# Patient Record
Sex: Male | Born: 1965 | Race: White | Hispanic: No | Marital: Married | Smoking: Never smoker
Health system: Southern US, Community
[De-identification: ages and names within clinical notes are randomized; demographics above are authoritative.]

## PROBLEM LIST (undated history)

## (undated) DIAGNOSIS — J309 Allergic rhinitis, unspecified: Secondary | ICD-10-CM

## (undated) DIAGNOSIS — I82409 Acute embolism and thrombosis of unspecified deep veins of unspecified lower extremity: Secondary | ICD-10-CM

## (undated) DIAGNOSIS — I10 Essential (primary) hypertension: Secondary | ICD-10-CM

## (undated) DIAGNOSIS — E7800 Pure hypercholesterolemia, unspecified: Secondary | ICD-10-CM

## (undated) DIAGNOSIS — M109 Gout, unspecified: Secondary | ICD-10-CM

## (undated) DIAGNOSIS — G629 Polyneuropathy, unspecified: Secondary | ICD-10-CM

## (undated) DIAGNOSIS — M797 Fibromyalgia: Secondary | ICD-10-CM

## (undated) DIAGNOSIS — G43909 Migraine, unspecified, not intractable, without status migrainosus: Secondary | ICD-10-CM

## (undated) HISTORY — DX: Essential (primary) hypertension: I10

## (undated) HISTORY — DX: Fibromyalgia: M79.7

## (undated) HISTORY — DX: Polyneuropathy, unspecified: G62.9

## (undated) HISTORY — DX: Gout, unspecified: M10.9

## (undated) HISTORY — DX: Migraine, unspecified, not intractable, without status migrainosus: G43.909

## (undated) HISTORY — PX: ABDOMINAL EXPLORATION SURGERY: SHX538

## (undated) HISTORY — DX: Allergic rhinitis, unspecified: J30.9

## (undated) HISTORY — DX: Pure hypercholesterolemia, unspecified: E78.00

## (undated) HISTORY — DX: Acute embolism and thrombosis of unspecified deep veins of unspecified lower extremity: I82.409

## (undated) HISTORY — PX: BACK SURGERY: SHX140

---

## 1999-01-08 ENCOUNTER — Ambulatory Visit (HOSPITAL_COMMUNITY): Admission: RE | Admit: 1999-01-08 | Discharge: 1999-01-08 | Payer: Self-pay | Admitting: Neurosurgery

## 1999-01-08 ENCOUNTER — Encounter: Payer: Self-pay | Admitting: Neurosurgery

## 2000-02-28 ENCOUNTER — Encounter: Payer: Self-pay | Admitting: Neurosurgery

## 2000-02-28 ENCOUNTER — Ambulatory Visit (HOSPITAL_COMMUNITY): Admission: RE | Admit: 2000-02-28 | Discharge: 2000-02-28 | Payer: Self-pay | Admitting: Neurosurgery

## 2001-09-26 ENCOUNTER — Encounter: Payer: Self-pay | Admitting: Neurosurgery

## 2001-09-26 ENCOUNTER — Encounter: Admission: RE | Admit: 2001-09-26 | Discharge: 2001-09-26 | Payer: Self-pay | Admitting: Neurosurgery

## 2003-05-24 ENCOUNTER — Encounter: Payer: Self-pay | Admitting: Neurosurgery

## 2003-05-24 ENCOUNTER — Ambulatory Visit (HOSPITAL_COMMUNITY): Admission: RE | Admit: 2003-05-24 | Discharge: 2003-05-24 | Payer: Self-pay | Admitting: Neurosurgery

## 2004-05-24 ENCOUNTER — Ambulatory Visit (HOSPITAL_COMMUNITY): Admission: RE | Admit: 2004-05-24 | Discharge: 2004-05-24 | Payer: Self-pay | Admitting: Neurosurgery

## 2006-03-19 ENCOUNTER — Ambulatory Visit (HOSPITAL_COMMUNITY): Admission: RE | Admit: 2006-03-19 | Discharge: 2006-03-19 | Payer: Self-pay | Admitting: Neurosurgery

## 2006-08-20 ENCOUNTER — Inpatient Hospital Stay (HOSPITAL_COMMUNITY): Admission: RE | Admit: 2006-08-20 | Discharge: 2006-08-23 | Payer: Self-pay | Admitting: Neurosurgery

## 2008-02-14 ENCOUNTER — Encounter: Admission: RE | Admit: 2008-02-14 | Discharge: 2008-02-14 | Payer: Self-pay | Admitting: Neurosurgery

## 2008-10-01 ENCOUNTER — Encounter (INDEPENDENT_AMBULATORY_CARE_PROVIDER_SITE_OTHER): Payer: Self-pay | Admitting: Surgery

## 2008-10-01 ENCOUNTER — Ambulatory Visit (HOSPITAL_BASED_OUTPATIENT_CLINIC_OR_DEPARTMENT_OTHER): Admission: RE | Admit: 2008-10-01 | Discharge: 2008-10-01 | Payer: Self-pay | Admitting: Surgery

## 2008-10-09 ENCOUNTER — Emergency Department (HOSPITAL_COMMUNITY): Admission: EM | Admit: 2008-10-09 | Discharge: 2008-10-09 | Payer: Self-pay | Admitting: Emergency Medicine

## 2009-08-17 HISTORY — PX: CORONARY ANGIOPLASTY WITH STENT PLACEMENT: SHX49

## 2010-01-17 IMAGING — CT CT PELVIS W/O CM
2 of 4 series · 14 of 32 positions shown, 19 images · IV contrast (READICAT)
Comparison: Plain films same day

CT ABDOMEN

CLINICAL DATA: Pain and surgical site left side of abdomen

CT ABDOMEN AND PELVIS WITHOUT CONTRAST
TECHNIQUE: Multidetector CT imaging of the abdomen and pelvis was
performed following the standard protocol without intravenous
contrast.

[Series 2: routine abdomen · axial · 0.90mm/px · z∈[-496,-72]mm · 8 of 111 slices shown, 13 images]
[im 13/111  soft-tissue]
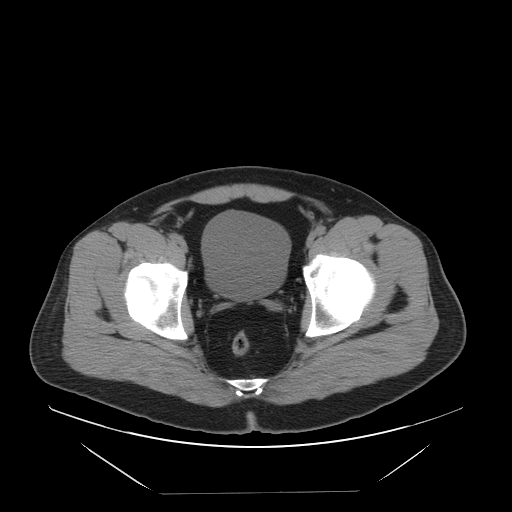
[im 13/111  bone]
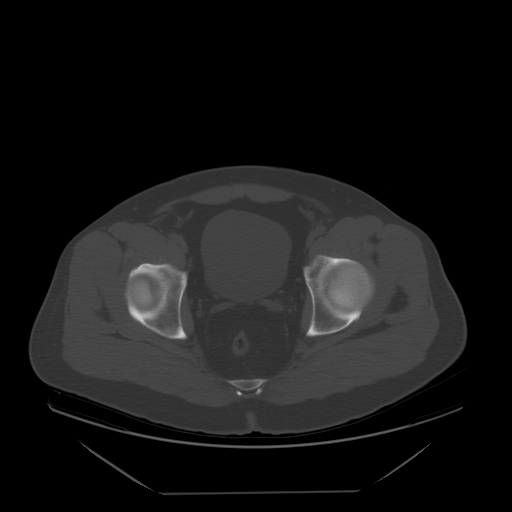
[im 25/111  soft-tissue]
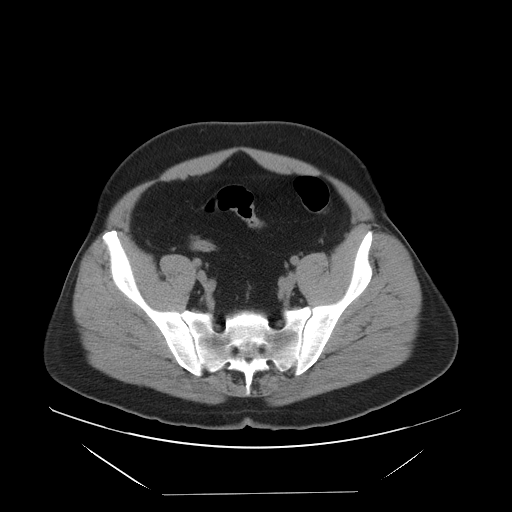
[im 37/111  soft-tissue]
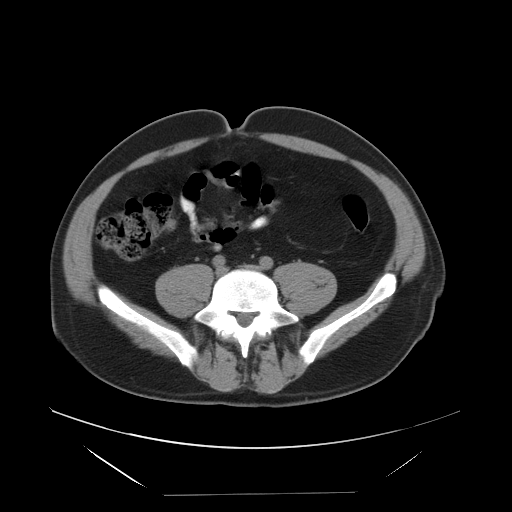
[im 49/111  soft-tissue]
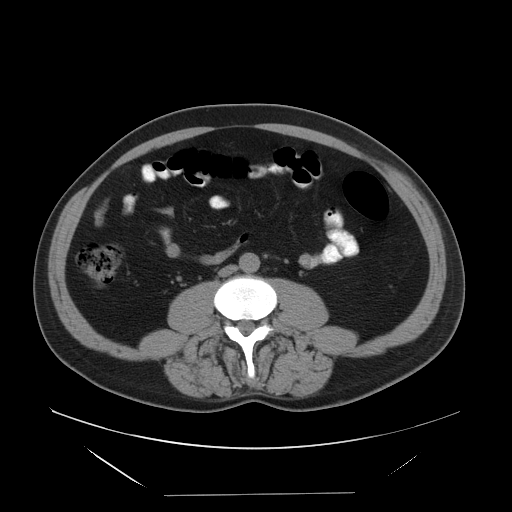
[im 62/111  soft-tissue]
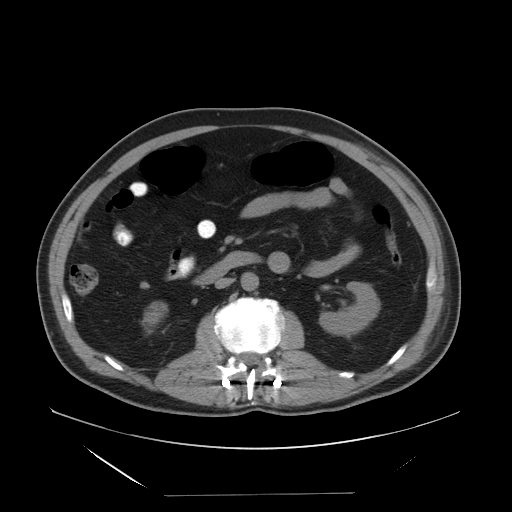
[im 62/111  lung]
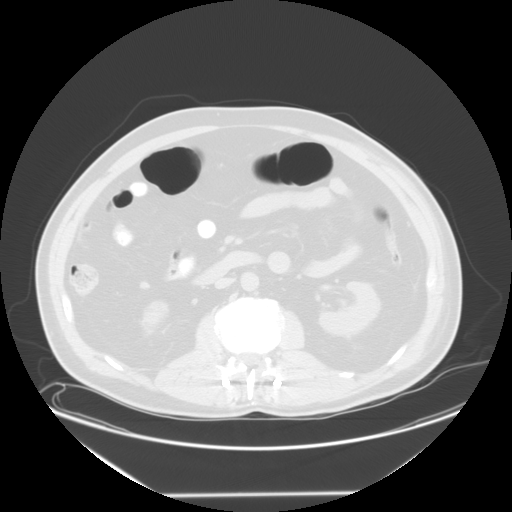
[im 74/111  soft-tissue]
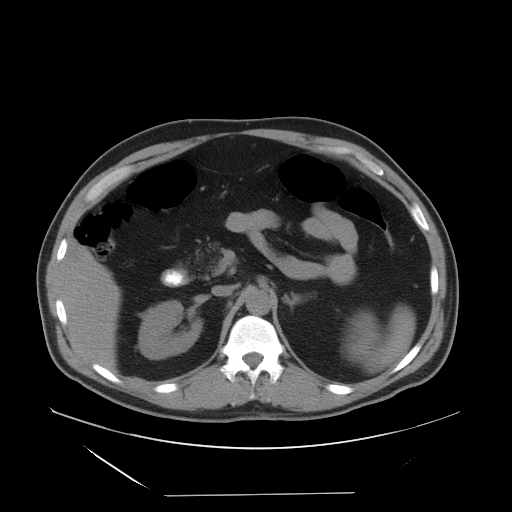
[im 74/111  lung]
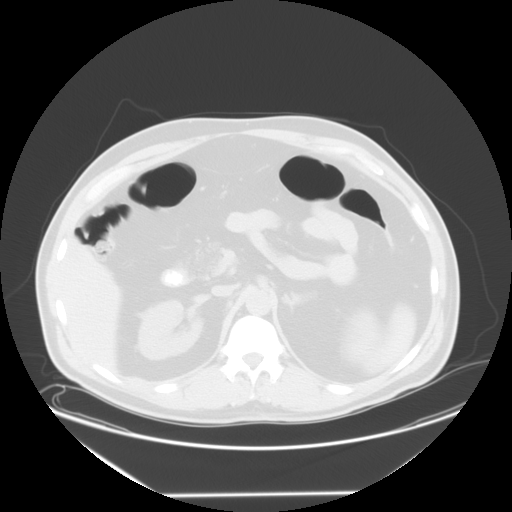
[im 86/111  soft-tissue]
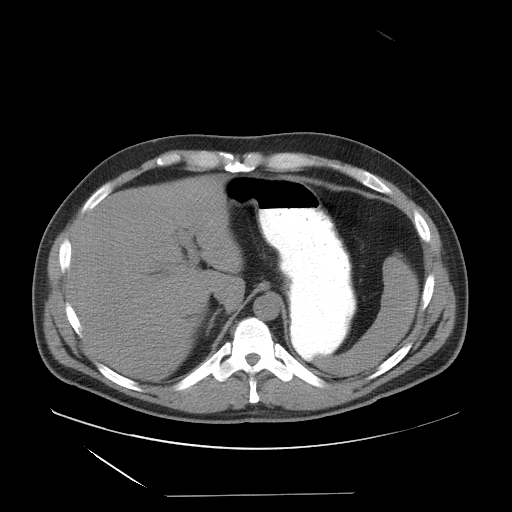
[im 86/111  lung]
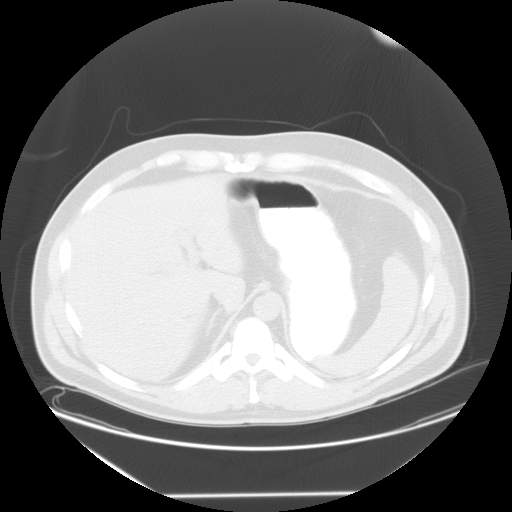
[im 98/111  soft-tissue]
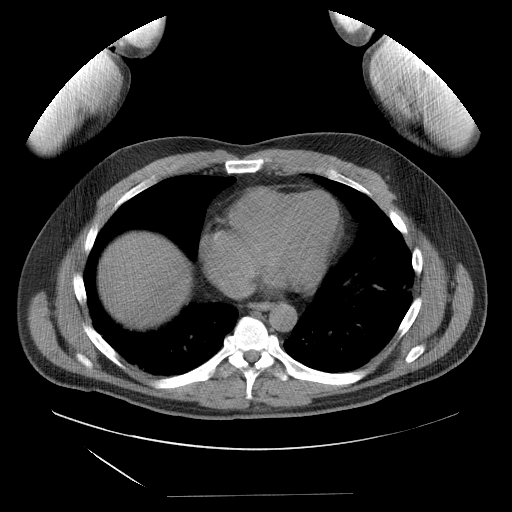
[im 98/111  lung]
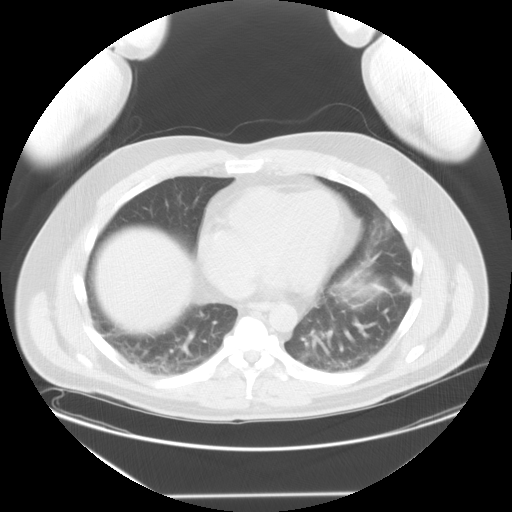

[Series 400: reformatted · sagittal · 1.09mm/px · 6 of 137 slices shown]
[im 14/137  soft-tissue]
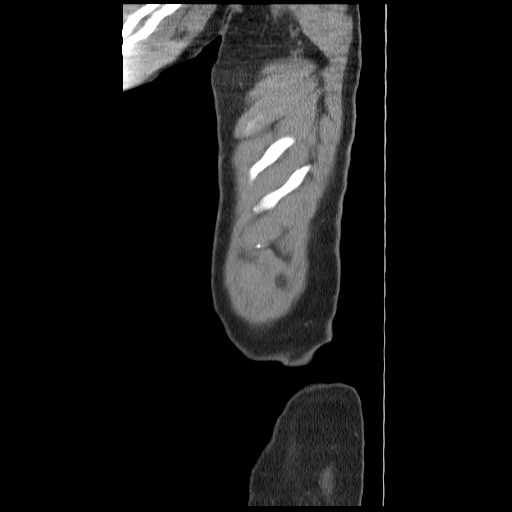
[im 28/137  soft-tissue]
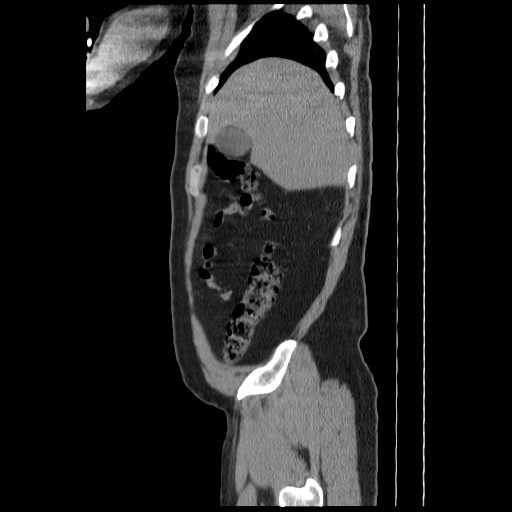
[im 41/137  soft-tissue]
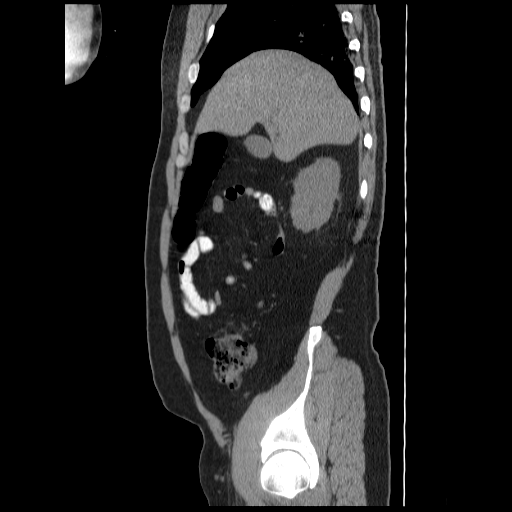
[im 55/137  soft-tissue]
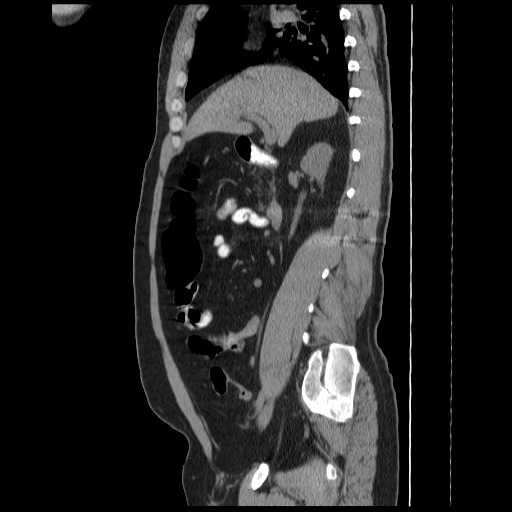
[im 82/137  soft-tissue]
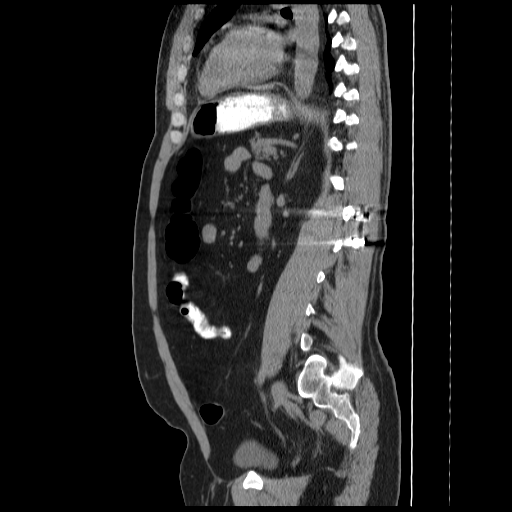
[im 96/137  soft-tissue]
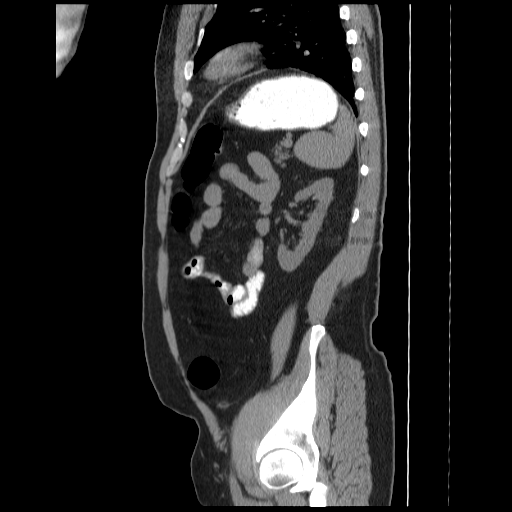

[14 of 32 positions shown; findings below may reference images not displayed]

FINDINGS: There is mild bibasilar atelectasis.  Heart appears
normal without evidence effusion.

Non-IV contrast images demonstrate no focal hepatic lesion.
Gallbladder, pancreas, spleen, adrenal glands, and kidneys appear
normal.  There are vascular calcifications in the renal hilum
versus nonobstructing stones.

The stomach, small bowel, appendix, and cecum appear normal.  Colon
appears normal.

Abdominal aorta is normal caliber.  No evidence retroperitoneal
lymphadenopathy.

Along the anterior left abdominal wall there is a very small
subcutaneous fluid collection measuring approximately 3 cm x
cm.  No evidence of gas within the collection.
IMPRESSION: Small superficial fluid collection at surgical site in the left
anterior abdominal wall.  No evidence of complication.

CT PELVIS
FINDINGS: No free fluid the pelvis.  The bladder and prostate
appear normal.  The rectum and sigmoid colon appear normal.

No evidence of pelvic lymphadenopathy. Review of  bone windows
demonstrates no aggressive osseous lesions. There is posterior
lumbar fusion at L1-L2 and interbody fusion at L5-S1.
IMPRESSION: No acute pelvic process.

## 2010-12-02 LAB — BASIC METABOLIC PANEL
BUN: 15 mg/dL (ref 6–23)
CO2: 29 mEq/L (ref 19–32)
Calcium: 9.8 mg/dL (ref 8.4–10.5)
Chloride: 104 mEq/L (ref 96–112)
Creatinine, Ser: 1.04 mg/dL (ref 0.4–1.5)
GFR calc Af Amer: >60 mL/min (ref >60)
GFR calc non Af Amer: >60 mL/min (ref >60)
Glucose, Bld: 101 mg/dL — ABNORMAL HIGH (ref 70–99)
Potassium: 4.6 mEq/L (ref 3.5–5.1)
Sodium: 141 mEq/L (ref 135–145)

## 2010-12-02 LAB — CBC
HCT: 44 % (ref 39.0–52.0)
HCT: 45.4 % (ref 39.0–52.0)
Hemoglobin: 15.3 g/dL (ref 13.0–17.0)
Hemoglobin: 15.8 g/dL (ref 13.0–17.0)
MCHC: 34.8 g/dL (ref 30.0–36.0)
MCHC: 34.9 g/dL (ref 30.0–36.0)
MCV: 83 fL (ref 78.0–100.0)
MCV: 82.9 fL (ref 78.0–100.0)
Platelets: 188 10*3/uL (ref 150–400)
Platelets: 197 10*3/uL (ref 150–400)
RBC: 5.3 MIL/uL (ref 4.22–5.81)
RBC: 5.48 MIL/uL (ref 4.22–5.81)
RDW: 13.5 % (ref 11.5–15.5)
RDW: 13.6 % (ref 11.5–15.5)
WBC: 4.1 10*3/uL (ref 4.0–10.5)
WBC: 5.2 10*3/uL (ref 4.0–10.5)

## 2010-12-02 LAB — DIFFERENTIAL
Basophils Absolute: 0 10*3/uL (ref 0.0–0.1)
Basophils Absolute: 0 10*3/uL (ref 0.0–0.1)
Basophils Relative: 1 % (ref 0–1)
Basophils Relative: 1 % (ref 0–1)
Eosinophils Absolute: 0.1 10*3/uL (ref 0.0–0.7)
Eosinophils Absolute: 0.3 10*3/uL (ref 0.0–0.7)
Eosinophils Relative: 2 % (ref 0–5)
Eosinophils Relative: 5 % (ref 0–5)
Lymphocytes Relative: 34 % (ref 12–46)
Lymphocytes Relative: 31 % (ref 12–46)
Lymphs Abs: 1.4 10*3/uL (ref 0.7–4.0)
Lymphs Abs: 1.6 10*3/uL (ref 0.7–4.0)
Monocytes Absolute: 0.3 10*3/uL (ref 0.1–1.0)
Monocytes Absolute: 0.5 10*3/uL (ref 0.1–1.0)
Monocytes Relative: 8 % (ref 3–12)
Monocytes Relative: 9 % (ref 3–12)
Neutro Abs: 2.3 10*3/uL (ref 1.7–7.7)
Neutro Abs: 2.8 10*3/uL (ref 1.7–7.7)
Neutrophils Relative %: 56 % (ref 43–77)
Neutrophils Relative %: 55 % (ref 43–77)

## 2010-12-02 LAB — URINALYSIS, ROUTINE W REFLEX MICROSCOPIC
Bilirubin Urine: NEGATIVE
Glucose, UA: NEGATIVE mg/dL
Hgb urine dipstick: NEGATIVE
Ketones, ur: NEGATIVE mg/dL
Nitrite: NEGATIVE
Protein, ur: NEGATIVE mg/dL
Specific Gravity, Urine: 1.024 (ref 1.005–1.030)
Urobilinogen, UA: 0.2 mg/dL (ref 0.0–1.0)
pH: 5.5 (ref 5.0–8.0)

## 2010-12-02 LAB — COMPREHENSIVE METABOLIC PANEL
ALT: 27 U/L (ref 0–53)
AST: 25 U/L (ref 0–37)
Albumin: 4 g/dL (ref 3.5–5.2)
Alkaline Phosphatase: 60 U/L (ref 39–117)
BUN: 21 mg/dL (ref 6–23)
CO2: 27 mEq/L (ref 19–32)
Calcium: 9.4 mg/dL (ref 8.4–10.5)
Chloride: 101 mEq/L (ref 96–112)
Creatinine, Ser: 1.34 mg/dL (ref 0.4–1.5)
GFR calc Af Amer: >60 mL/min (ref >60)
GFR calc non Af Amer: 58 mL/min — ABNORMAL LOW (ref >60)
Glucose, Bld: 112 mg/dL — ABNORMAL HIGH (ref 70–99)
Potassium: 4.1 mEq/L (ref 3.5–5.1)
Sodium: 135 mEq/L (ref 135–145)
Total Bilirubin: 1.4 mg/dL — ABNORMAL HIGH (ref 0.3–1.2)
Total Protein: 6.7 g/dL (ref 6.0–8.3)

## 2010-12-02 LAB — PROTIME-INR
INR: 1 (ref 0.00–1.49)
Prothrombin Time: 13.6 seconds (ref 11.6–15.2)

## 2010-12-30 NOTE — Op Note (Signed)
NAMEDECLAN, Jose [name] NO.:  [id number]   MEDICAL RECORD NO.:  [id number]          PATIENT TYPE:  AMB   LOCATION:  DSC                          FACILITY:  [facility]   PHYSICIAN:  Velora Heckler, MD      DATE OF BIRTH:  22-May-1966   DATE OF PROCEDURE:  10/01/2008  DATE OF DISCHARGE:                               OPERATIVE REPORT   PREOPERATIVE DIAGNOSIS:  Soft tissue mass, left upper quadrant abdominal  wall, rule out hernia.   POSTOPERATIVE DIAGNOSIS:  Soft tissue mass, left upper quadrant  abdominal wall, rule out hernia.   PROCEDURES:  1. Explore left upper quadrant abdominal wall, rule out hernia.  2. Excision soft tissue mass, left upper quadrant abdominal wall (3 x      3 x 2 cm).   SURGEON:  Velora Heckler, MD, FACS   ANESTHESIA:  General.   ESTIMATED BLOOD LOSS:  Minimal.   PREPARATION:  Chlorhexidine   COMPLICATIONS:  None.   INDICATIONS:  The patient is a 45 year old white male from [place],  [place].  The patient has had a 71-month history of pain in the  left upper quadrant of the abdominal wall.  He has had thorough  evaluation including upper endoscopy, colonoscopy, small bowel follow  through series, ultrasound, CT scan of the abdomen, and MRI scan.  He  requires narcotic analgesics and Lyrica for pain control.  The patient  is now referred for consideration of excision of soft tissue mass and  rule out hernia, left upper quadrant abdominal wall.   BODY OF REPORT:  Procedure was done in OR #3 at the [facility].  The patient was brought to the operating room, placed in supine  position on the operating room table.  Prior to anesthesia, the patient  had been placed in a standing and a recumbent position and he and I had  agreed on the site of pain and the palpable mass in the left upper  quadrant.  This had been marked with a marking pen.   The patient was placed in supine position on the operating room table.  Following administration of general anesthesia, the patient was prepped  and draped in usual strict aseptic fashion.  After ascertaining that an  adequate level of anesthesia had been achieved, a 5-cm incision was made  in the left upper quadrant of the abdominal wall at the site previously  determined.  Dissection was carried into the subcutaneous tissues.  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  It is  quite firm.  It measures 3 x 3 x 2 cm in toto.  It was excised in its  entirety with the electrocautery.  No obvious cutaneous nerves appeared  to be involved.  No obvious fascial defects were identified.   External oblique fascia was incised in line of its fibers and extended  into the lateral edge of the rectus sheath.  Plane beneath the external  oblique fascia was then developed and inspected.  No evidence of  abdominal wall hernia was identified.  Good hemostasis was noted with  the electrocautery.  Fascia was closed with interrupted 0 Ethibond  sutures.  Fascial plane was again developed circumferentially beneath  the wound and good hemostasis was noted.  No cutaneous nerves were  noted.  No further palpable masses were noted.  No significant  abnormalities were identified.  Subcutaneous tissues were closed with a  running 2-0 Vicryl suture.  Skin was anesthetized with local Marcaine  anesthetic.  Skin was closed with running 4-0 Monocryl subcuticular  suture.  Wound was washed and dried and Benzoin and Steri-Strips were  applied.  Sterile dressings were applied.  The patient was awakened from  anesthesia and brought to the recovery room in stable condition.  The  patient tolerated the procedure well.      Velora Heckler, MD  Electronically Signed     TMG/MEDQ  D:  10/01/2008  T:  10/02/2008  Job:  [id number]   cc:   Payton Doughty, M.D.  Lucila Maine

## 2011-01-02 NOTE — Discharge Summary (Signed)
[name], [name] NO.:  [id number]   MEDICAL RECORD NO.:  [id number]          PATIENT TYPE:  INP   LOCATION:  3013                         FACILITY:  [facility]   PHYSICIAN:  Payton Doughty, M.D.      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   PROCEDURES:  L1-2 laminectomy, fasciectomy, pedicle screws and  posterolateral arthrodesis.   SURGEON:  Dr. Channing Mutters   COMPLICATIONS:  None.   DISCHARGE STATUS:  Alive and well.   A 45 year old right-handed white gentleman whose history and physical  has been dictated, it has not yet reached the chart.  He has had lumbar  spondylosis.  He had a diskectomy at L1-2 about ten years ago.  He has  developed a kyphotic deformity.  His exam is intact, but he does lose  strength when he stands.  He was admitted after ascertaining normal  laboratory values and underwent the operations as noted above.  Postoperatively, he has done well, legs feel much better.  Intraoperatively, it was noted on his x-ray that the lumbar alignment  was restored to virtually normal.  His incision had some additional  swelling which has resolved.  He is up and knows to wear his braces at  all times when he goes home, his strength is full.  He is being  discharged to home in the care of his family with Percocet for pain and  five days of ciprofloxacin.  He is to follow up with me in the [facility] office in a week for sutures.           ______________________________  Payton Doughty, M.D.     MWR/MEDQ  D:  08/23/2006  T:  08/23/2006  Job:  [id number]

## 2011-01-02 NOTE — H&P (Signed)
Jose [name], FOSDICK NO.:  [id number]   MEDICAL RECORD NO.:  [id number]          PATIENT TYPE:  INP   LOCATION:  2899                         FACILITY:  [facility]   PHYSICIAN:  Payton Doughty, M.D.      DATE OF BIRTH:  14-Jul-1966   DATE OF ADMISSION:  08/20/2006  DATE OF DISCHARGE:                              HISTORY & PHYSICAL   ADMISSION DIAGNOSIS:  Spondylosis L1-2.   BODY OF TEXT:  This is 45 year old right-handed white gentleman who has  had several back operations including a discectomy to L5-S1, a fusion at  L5-S1, a discectomy at L1-L2.  He has been experiencing increasing back  pain, some pain around his abdomen.  MRI demonstrates disc at L1-2 with  a beginning of a kyphotic deformity and he is admitted now for  decompression fusion at that level.   PAST MEDICAL HISTORY:  Relatively unremarkable except for hypertension.  He takes Lorcet on a p.r.n. basis and is allergic to IODINE DYE.   SOCIAL HISTORY:  He does not smoke or drink and is on Disability.   FAMILY HISTORY:  Mom is 64 and in good health.  Dad is 26 in good health  with heart disease.   REVIEW OF SYSTEMS:  Remarkable for back and abdominal pain.  HEENT:  Normal limits.  NECK:  Good range of motion, supple, no lymphadenopathy.  CHEST:  Clear.  CARDIAC:  Regular rate and rhythm.  ABDOMEN:  Nontender  with no hepatosplenomegaly.  EXTREMITIES:  Without clubbing or cyanosis.  Peripheral pulses are good.  GU:  Deferred.  NEURO:  Awake, alert, and  oriented.  Cranial nerves are intact.  Motor exam shows 5/5 strength  throughout the upper and lower extremities.  Sensory disease is  described during L2 distribution.  Reflexes are 1 at the knees, absent  at the ankles.  Toes downgoing bilaterally.  Straight leg raise is  negative.  He has a lot of discomfort with standing and bending.   MRI results have been reviewed above.   CLINICAL IMPRESSION:  Lumbar spondylosis L1-2.   PLAN:  Removal of  set-joints.  Placement of cages and pedicle screws.  If cages can not be placed, pedicle screws alone will suffice to get him  back into extension.  The risks and benefits of this approach have  discussed with him and he wishes to proceed.           ______________________________  Payton Doughty, M.D.     MWR/MEDQ  D:  08/20/2006  T:  08/20/2006  Job:  [id number]

## 2011-01-02 NOTE — Op Note (Signed)
NAMEVERLON, PISCHKE NO.:  [id number]   MEDICAL RECORD NO.:  [id number]          PATIENT TYPE:  INP   LOCATION:  2899                         FACILITY:  [facility]   PHYSICIAN:  Payton Doughty, M.D.      DATE OF BIRTH:  26-Mar-1966   DATE OF PROCEDURE:  08/20/2006  DATE OF DISCHARGE:                               OPERATIVE REPORT   PREOPERATIVE DIAGNOSIS:  Spondylosis and kyphosis at L1-2.   POSTOPERATIVE DIAGNOSIS:  Spondylosis and kyphosis at L1-2.   PROCEDURE:  L1-2 facetectomy laminectomy L1-2 pedicle screws and  posterolateral arthrodesis.   ANESTHESIA:  General endotracheal _______________ prepped with alcohol  wipe.   COMPLICATIONS:  None.   NURSE ASSISTANCE:  [place].   BODY OF TEXT:  A 45 year old gentleman with severe back pain and a  kyphos over an old laminectomy taken to the operative room and smoothly  anesthetized and intubated, placed prone on the operating table.  Following shave, prep, and drape in the usual sterile fashion, skin was  incised from the bottom of T12 to the top of L2, and the remaining  lamina of L1 and the lamina of L2 were exposed bilaterally in  subperiosteal plane.  Several intraoperative x-rays confirmed correct  level.  Having confirmed correct level, the pars inarticularis,  remaining lamina and inferior facet of L1, superior facet of L2 were  removed used using a high-speed drill.  This allowed decompression and  also allowed the reduction of the kyphos that was developing there.  Intraoperative films revealed good reduction of kyphos, so a diskectomy  was carried out.  However, it was not feasible to place cages because of  the anatomy of the patient.  Therefore, pedicle screws were placed at L1  and L2.  The transverse processes were decorticated with a high-speed  drill and packed with BMP putty.  A rod was laid across the pedicle  screws and tightened down, and the caps placed on it and tightened.  Intraoperative  x-ray showed good retention of normal lordosis.  Wound  was irrigated, hemostasis assured and successive layers of 0 Vicryl, 2-0  Vicryl, 3-0 nylon were used to close.  Neosporin and Telfa dressing was  applied.  The patient returned to recovery room in good condition.           ______________________________  Payton Doughty, M.D.     MWR/MEDQ  D:  08/20/2006  T:  08/20/2006  Job:  [id number]

## 2011-10-05 DIAGNOSIS — I209 Angina pectoris, unspecified: Secondary | ICD-10-CM | POA: Diagnosis not present

## 2011-10-05 DIAGNOSIS — I10 Essential (primary) hypertension: Secondary | ICD-10-CM | POA: Diagnosis not present

## 2011-10-05 DIAGNOSIS — I2510 Atherosclerotic heart disease of native coronary artery without angina pectoris: Secondary | ICD-10-CM | POA: Diagnosis not present

## 2011-10-05 DIAGNOSIS — R079 Chest pain, unspecified: Secondary | ICD-10-CM | POA: Diagnosis not present

## 2011-10-05 DIAGNOSIS — E785 Hyperlipidemia, unspecified: Secondary | ICD-10-CM | POA: Diagnosis not present

## 2011-10-08 DIAGNOSIS — E785 Hyperlipidemia, unspecified: Secondary | ICD-10-CM | POA: Diagnosis not present

## 2011-10-08 DIAGNOSIS — R079 Chest pain, unspecified: Secondary | ICD-10-CM | POA: Diagnosis not present

## 2011-10-08 DIAGNOSIS — I2510 Atherosclerotic heart disease of native coronary artery without angina pectoris: Secondary | ICD-10-CM | POA: Diagnosis not present

## 2011-10-21 DIAGNOSIS — M47812 Spondylosis without myelopathy or radiculopathy, cervical region: Secondary | ICD-10-CM | POA: Diagnosis not present

## 2011-10-21 DIAGNOSIS — M47817 Spondylosis without myelopathy or radiculopathy, lumbosacral region: Secondary | ICD-10-CM | POA: Diagnosis not present

## 2012-01-31 DIAGNOSIS — R110 Nausea: Secondary | ICD-10-CM | POA: Diagnosis not present

## 2012-01-31 DIAGNOSIS — R1032 Left lower quadrant pain: Secondary | ICD-10-CM | POA: Diagnosis not present

## 2012-01-31 DIAGNOSIS — N201 Calculus of ureter: Secondary | ICD-10-CM | POA: Diagnosis not present

## 2012-04-06 DIAGNOSIS — M47817 Spondylosis without myelopathy or radiculopathy, lumbosacral region: Secondary | ICD-10-CM | POA: Diagnosis not present

## 2012-04-06 DIAGNOSIS — M47812 Spondylosis without myelopathy or radiculopathy, cervical region: Secondary | ICD-10-CM | POA: Diagnosis not present

## 2012-04-07 DIAGNOSIS — K219 Gastro-esophageal reflux disease without esophagitis: Secondary | ICD-10-CM | POA: Diagnosis not present

## 2012-07-18 DIAGNOSIS — M47817 Spondylosis without myelopathy or radiculopathy, lumbosacral region: Secondary | ICD-10-CM | POA: Diagnosis not present

## 2012-07-18 DIAGNOSIS — M47812 Spondylosis without myelopathy or radiculopathy, cervical region: Secondary | ICD-10-CM | POA: Diagnosis not present

## 2012-08-29 DIAGNOSIS — J309 Allergic rhinitis, unspecified: Secondary | ICD-10-CM | POA: Diagnosis not present

## 2012-08-29 DIAGNOSIS — J0100 Acute maxillary sinusitis, unspecified: Secondary | ICD-10-CM | POA: Diagnosis not present

## 2012-10-20 DIAGNOSIS — M47817 Spondylosis without myelopathy or radiculopathy, lumbosacral region: Secondary | ICD-10-CM | POA: Diagnosis not present

## 2012-10-20 DIAGNOSIS — M47812 Spondylosis without myelopathy or radiculopathy, cervical region: Secondary | ICD-10-CM | POA: Diagnosis not present

## 2012-11-02 DIAGNOSIS — E782 Mixed hyperlipidemia: Secondary | ICD-10-CM | POA: Diagnosis not present

## 2012-11-02 DIAGNOSIS — E785 Hyperlipidemia, unspecified: Secondary | ICD-10-CM | POA: Diagnosis not present

## 2012-11-02 DIAGNOSIS — M109 Gout, unspecified: Secondary | ICD-10-CM | POA: Diagnosis not present

## 2012-11-02 DIAGNOSIS — R079 Chest pain, unspecified: Secondary | ICD-10-CM | POA: Diagnosis not present

## 2012-11-02 DIAGNOSIS — I2510 Atherosclerotic heart disease of native coronary artery without angina pectoris: Secondary | ICD-10-CM | POA: Diagnosis not present

## 2012-11-02 DIAGNOSIS — I10 Essential (primary) hypertension: Secondary | ICD-10-CM | POA: Diagnosis not present

## 2012-11-09 DIAGNOSIS — E782 Mixed hyperlipidemia: Secondary | ICD-10-CM | POA: Diagnosis not present

## 2012-11-09 DIAGNOSIS — M109 Gout, unspecified: Secondary | ICD-10-CM | POA: Diagnosis not present

## 2013-02-07 DIAGNOSIS — M47817 Spondylosis without myelopathy or radiculopathy, lumbosacral region: Secondary | ICD-10-CM | POA: Diagnosis not present

## 2013-02-15 ENCOUNTER — Other Ambulatory Visit: Payer: Self-pay | Admitting: Neurosurgery

## 2013-02-15 DIAGNOSIS — M545 Low back pain: Secondary | ICD-10-CM

## 2013-02-26 ENCOUNTER — Ambulatory Visit
Admission: RE | Admit: 2013-02-26 | Discharge: 2013-02-26 | Disposition: A | Discharge status: Home / Self Care | Payer: Medicare Other | Source: Ambulatory Visit | Attending: Neurosurgery | Admitting: Neurosurgery

## 2013-02-26 DIAGNOSIS — IMO0002 Reserved for concepts with insufficient information to code with codable children: Secondary | ICD-10-CM | POA: Diagnosis not present

## 2013-02-26 DIAGNOSIS — M545 Low back pain: Secondary | ICD-10-CM

## 2013-03-02 DIAGNOSIS — J019 Acute sinusitis, unspecified: Secondary | ICD-10-CM | POA: Diagnosis not present

## 2013-03-06 DIAGNOSIS — I10 Essential (primary) hypertension: Secondary | ICD-10-CM | POA: Diagnosis not present

## 2013-03-06 DIAGNOSIS — Z006 Encounter for examination for normal comparison and control in clinical research program: Secondary | ICD-10-CM | POA: Diagnosis not present

## 2013-03-10 ENCOUNTER — Other Ambulatory Visit: Payer: Self-pay | Admitting: Neurosurgery

## 2013-03-10 DIAGNOSIS — M47817 Spondylosis without myelopathy or radiculopathy, lumbosacral region: Secondary | ICD-10-CM | POA: Diagnosis not present

## 2013-03-10 DIAGNOSIS — M47816 Spondylosis without myelopathy or radiculopathy, lumbar region: Secondary | ICD-10-CM

## 2013-03-15 ENCOUNTER — Ambulatory Visit
Admission: RE | Admit: 2013-03-15 | Discharge: 2013-03-15 | Disposition: A | Discharge status: Home / Self Care | Payer: Medicare Other | Source: Ambulatory Visit | Attending: Neurosurgery | Admitting: Neurosurgery

## 2013-03-15 VITALS — BP 140/98 | HR 96

## 2013-03-15 DIAGNOSIS — M47816 Spondylosis without myelopathy or radiculopathy, lumbar region: Secondary | ICD-10-CM

## 2013-03-15 MED ORDER — IOHEXOL 180 MG/ML  SOLN
1.0000 mL | Freq: Once | INTRAMUSCULAR | Status: AC | PRN
  Administered 2013-03-15: 1 mL via INTRAVENOUS

## 2013-03-15 MED ORDER — METHYLPREDNISOLONE ACETATE 40 MG/ML INJ SUSP (RADIOLOG
120.0000 mg | Freq: Once | INTRAMUSCULAR | Status: AC
  Administered 2013-03-15: 120 mg via EPIDURAL

## 2013-04-07 DIAGNOSIS — Z79899 Other long term (current) drug therapy: Secondary | ICD-10-CM | POA: Diagnosis not present

## 2013-04-19 DIAGNOSIS — M47812 Spondylosis without myelopathy or radiculopathy, cervical region: Secondary | ICD-10-CM | POA: Diagnosis not present

## 2013-04-19 DIAGNOSIS — M47817 Spondylosis without myelopathy or radiculopathy, lumbosacral region: Secondary | ICD-10-CM | POA: Diagnosis not present

## 2013-04-27 DIAGNOSIS — IMO0001 Reserved for inherently not codable concepts without codable children: Secondary | ICD-10-CM | POA: Diagnosis not present

## 2013-04-27 DIAGNOSIS — R209 Unspecified disturbances of skin sensation: Secondary | ICD-10-CM | POA: Diagnosis not present

## 2013-04-27 DIAGNOSIS — R079 Chest pain, unspecified: Secondary | ICD-10-CM | POA: Diagnosis not present

## 2013-04-27 DIAGNOSIS — E782 Mixed hyperlipidemia: Secondary | ICD-10-CM | POA: Diagnosis not present

## 2013-05-01 DIAGNOSIS — D510 Vitamin B12 deficiency anemia due to intrinsic factor deficiency: Secondary | ICD-10-CM | POA: Diagnosis not present

## 2013-05-08 DIAGNOSIS — D510 Vitamin B12 deficiency anemia due to intrinsic factor deficiency: Secondary | ICD-10-CM | POA: Diagnosis not present

## 2013-05-15 DIAGNOSIS — D510 Vitamin B12 deficiency anemia due to intrinsic factor deficiency: Secondary | ICD-10-CM | POA: Diagnosis not present

## 2013-05-22 DIAGNOSIS — D510 Vitamin B12 deficiency anemia due to intrinsic factor deficiency: Secondary | ICD-10-CM | POA: Diagnosis not present

## 2013-06-21 DIAGNOSIS — J0100 Acute maxillary sinusitis, unspecified: Secondary | ICD-10-CM | POA: Diagnosis not present

## 2013-06-21 DIAGNOSIS — J310 Chronic rhinitis: Secondary | ICD-10-CM | POA: Diagnosis not present

## 2013-06-21 DIAGNOSIS — D510 Vitamin B12 deficiency anemia due to intrinsic factor deficiency: Secondary | ICD-10-CM | POA: Diagnosis not present

## 2013-07-10 DIAGNOSIS — D510 Vitamin B12 deficiency anemia due to intrinsic factor deficiency: Secondary | ICD-10-CM | POA: Diagnosis not present

## 2013-07-19 DIAGNOSIS — M47812 Spondylosis without myelopathy or radiculopathy, cervical region: Secondary | ICD-10-CM | POA: Diagnosis not present

## 2013-07-19 DIAGNOSIS — M47817 Spondylosis without myelopathy or radiculopathy, lumbosacral region: Secondary | ICD-10-CM | POA: Diagnosis not present

## 2013-08-15 DIAGNOSIS — J0100 Acute maxillary sinusitis, unspecified: Secondary | ICD-10-CM | POA: Diagnosis not present

## 2013-09-19 DIAGNOSIS — J019 Acute sinusitis, unspecified: Secondary | ICD-10-CM | POA: Diagnosis not present

## 2013-10-26 DIAGNOSIS — M47817 Spondylosis without myelopathy or radiculopathy, lumbosacral region: Secondary | ICD-10-CM | POA: Diagnosis not present

## 2013-11-02 DIAGNOSIS — E7800 Pure hypercholesterolemia, unspecified: Secondary | ICD-10-CM | POA: Diagnosis not present

## 2013-11-02 DIAGNOSIS — E782 Mixed hyperlipidemia: Secondary | ICD-10-CM | POA: Diagnosis not present

## 2013-11-02 DIAGNOSIS — M109 Gout, unspecified: Secondary | ICD-10-CM | POA: Diagnosis not present

## 2013-11-02 DIAGNOSIS — D510 Vitamin B12 deficiency anemia due to intrinsic factor deficiency: Secondary | ICD-10-CM | POA: Diagnosis not present

## 2013-11-02 DIAGNOSIS — Z0000 Encounter for general adult medical examination without abnormal findings: Secondary | ICD-10-CM | POA: Diagnosis not present

## 2013-11-24 DIAGNOSIS — M47817 Spondylosis without myelopathy or radiculopathy, lumbosacral region: Secondary | ICD-10-CM | POA: Diagnosis not present

## 2013-12-05 ENCOUNTER — Other Ambulatory Visit: Payer: Self-pay | Admitting: Neurosurgery

## 2013-12-05 DIAGNOSIS — M47817 Spondylosis without myelopathy or radiculopathy, lumbosacral region: Secondary | ICD-10-CM

## 2013-12-11 ENCOUNTER — Ambulatory Visit
Admission: RE | Admit: 2013-12-11 | Discharge: 2013-12-11 | Disposition: A | Discharge status: Home / Self Care | Payer: Medicare Other | Source: Ambulatory Visit | Attending: Neurosurgery | Admitting: Neurosurgery

## 2013-12-11 ENCOUNTER — Encounter (INDEPENDENT_AMBULATORY_CARE_PROVIDER_SITE_OTHER): Payer: Self-pay

## 2013-12-11 DIAGNOSIS — M5137 Other intervertebral disc degeneration, lumbosacral region: Secondary | ICD-10-CM | POA: Diagnosis not present

## 2013-12-11 DIAGNOSIS — M47817 Spondylosis without myelopathy or radiculopathy, lumbosacral region: Secondary | ICD-10-CM

## 2013-12-19 DIAGNOSIS — L0390 Cellulitis, unspecified: Secondary | ICD-10-CM | POA: Diagnosis not present

## 2013-12-19 DIAGNOSIS — L0291 Cutaneous abscess, unspecified: Secondary | ICD-10-CM | POA: Diagnosis not present

## 2014-01-01 DIAGNOSIS — L0201 Cutaneous abscess of face: Secondary | ICD-10-CM | POA: Diagnosis not present

## 2014-01-01 DIAGNOSIS — L03211 Cellulitis of face: Secondary | ICD-10-CM | POA: Diagnosis not present

## 2014-01-03 DIAGNOSIS — M47817 Spondylosis without myelopathy or radiculopathy, lumbosacral region: Secondary | ICD-10-CM | POA: Diagnosis not present

## 2014-01-03 DIAGNOSIS — M47812 Spondylosis without myelopathy or radiculopathy, cervical region: Secondary | ICD-10-CM | POA: Diagnosis not present

## 2014-01-22 DIAGNOSIS — M47817 Spondylosis without myelopathy or radiculopathy, lumbosacral region: Secondary | ICD-10-CM | POA: Diagnosis not present

## 2014-01-22 DIAGNOSIS — M47812 Spondylosis without myelopathy or radiculopathy, cervical region: Secondary | ICD-10-CM | POA: Diagnosis not present

## 2014-01-22 DIAGNOSIS — M2559 Pain in other specified joint: Secondary | ICD-10-CM | POA: Diagnosis not present

## 2014-02-09 DIAGNOSIS — Z79899 Other long term (current) drug therapy: Secondary | ICD-10-CM | POA: Diagnosis not present

## 2014-02-09 DIAGNOSIS — I10 Essential (primary) hypertension: Secondary | ICD-10-CM | POA: Diagnosis not present

## 2014-02-09 DIAGNOSIS — Z9861 Coronary angioplasty status: Secondary | ICD-10-CM | POA: Diagnosis not present

## 2014-02-09 DIAGNOSIS — K219 Gastro-esophageal reflux disease without esophagitis: Secondary | ICD-10-CM | POA: Diagnosis not present

## 2014-02-09 DIAGNOSIS — Z888 Allergy status to other drugs, medicaments and biological substances status: Secondary | ICD-10-CM | POA: Diagnosis not present

## 2014-02-09 DIAGNOSIS — M47812 Spondylosis without myelopathy or radiculopathy, cervical region: Secondary | ICD-10-CM | POA: Diagnosis not present

## 2014-02-09 DIAGNOSIS — Z833 Family history of diabetes mellitus: Secondary | ICD-10-CM | POA: Diagnosis not present

## 2014-02-09 DIAGNOSIS — Z87442 Personal history of urinary calculi: Secondary | ICD-10-CM | POA: Diagnosis not present

## 2014-02-09 DIAGNOSIS — M109 Gout, unspecified: Secondary | ICD-10-CM | POA: Diagnosis not present

## 2014-02-09 DIAGNOSIS — Z8249 Family history of ischemic heart disease and other diseases of the circulatory system: Secondary | ICD-10-CM | POA: Diagnosis not present

## 2014-02-09 DIAGNOSIS — M47817 Spondylosis without myelopathy or radiculopathy, lumbosacral region: Secondary | ICD-10-CM | POA: Diagnosis not present

## 2014-02-09 DIAGNOSIS — M2559 Pain in other specified joint: Secondary | ICD-10-CM | POA: Diagnosis not present

## 2014-02-09 DIAGNOSIS — Z7982 Long term (current) use of aspirin: Secondary | ICD-10-CM | POA: Diagnosis not present

## 2014-02-09 DIAGNOSIS — Z809 Family history of malignant neoplasm, unspecified: Secondary | ICD-10-CM | POA: Diagnosis not present

## 2014-02-09 DIAGNOSIS — E7800 Pure hypercholesterolemia, unspecified: Secondary | ICD-10-CM | POA: Diagnosis not present

## 2014-02-09 DIAGNOSIS — Z981 Arthrodesis status: Secondary | ICD-10-CM | POA: Diagnosis not present

## 2014-04-03 DIAGNOSIS — L0390 Cellulitis, unspecified: Secondary | ICD-10-CM | POA: Diagnosis not present

## 2014-04-03 DIAGNOSIS — L0291 Cutaneous abscess, unspecified: Secondary | ICD-10-CM | POA: Diagnosis not present

## 2014-04-09 DIAGNOSIS — M109 Gout, unspecified: Secondary | ICD-10-CM | POA: Diagnosis not present

## 2014-04-09 DIAGNOSIS — L02429 Furuncle of limb, unspecified: Secondary | ICD-10-CM | POA: Diagnosis not present

## 2014-04-18 DIAGNOSIS — M47812 Spondylosis without myelopathy or radiculopathy, cervical region: Secondary | ICD-10-CM | POA: Diagnosis not present

## 2014-04-18 DIAGNOSIS — M47817 Spondylosis without myelopathy or radiculopathy, lumbosacral region: Secondary | ICD-10-CM | POA: Diagnosis not present

## 2014-08-06 DIAGNOSIS — H1033 Unspecified acute conjunctivitis, bilateral: Secondary | ICD-10-CM | POA: Diagnosis not present

## 2014-08-27 DIAGNOSIS — L0291 Cutaneous abscess, unspecified: Secondary | ICD-10-CM | POA: Diagnosis not present

## 2014-08-27 DIAGNOSIS — D510 Vitamin B12 deficiency anemia due to intrinsic factor deficiency: Secondary | ICD-10-CM | POA: Diagnosis not present

## 2014-08-27 DIAGNOSIS — L0390 Cellulitis, unspecified: Secondary | ICD-10-CM | POA: Diagnosis not present

## 2014-08-27 DIAGNOSIS — I10 Essential (primary) hypertension: Secondary | ICD-10-CM | POA: Diagnosis not present

## 2014-10-03 DIAGNOSIS — M47816 Spondylosis without myelopathy or radiculopathy, lumbar region: Secondary | ICD-10-CM | POA: Diagnosis not present

## 2014-10-03 DIAGNOSIS — M4302 Spondylolysis, cervical region: Secondary | ICD-10-CM | POA: Diagnosis not present

## 2014-12-06 DIAGNOSIS — J0100 Acute maxillary sinusitis, unspecified: Secondary | ICD-10-CM | POA: Diagnosis not present

## 2014-12-06 DIAGNOSIS — J309 Allergic rhinitis, unspecified: Secondary | ICD-10-CM | POA: Diagnosis not present

## 2015-01-02 DIAGNOSIS — M4302 Spondylolysis, cervical region: Secondary | ICD-10-CM | POA: Diagnosis not present

## 2015-01-02 DIAGNOSIS — M47816 Spondylosis without myelopathy or radiculopathy, lumbar region: Secondary | ICD-10-CM | POA: Diagnosis not present

## 2015-01-17 DIAGNOSIS — B9689 Other specified bacterial agents as the cause of diseases classified elsewhere: Secondary | ICD-10-CM | POA: Diagnosis not present

## 2015-01-17 DIAGNOSIS — J301 Allergic rhinitis due to pollen: Secondary | ICD-10-CM | POA: Diagnosis not present

## 2015-01-17 DIAGNOSIS — J019 Acute sinusitis, unspecified: Secondary | ICD-10-CM | POA: Diagnosis not present

## 2015-01-23 DIAGNOSIS — J301 Allergic rhinitis due to pollen: Secondary | ICD-10-CM | POA: Diagnosis not present

## 2015-03-18 DIAGNOSIS — I82409 Acute embolism and thrombosis of unspecified deep veins of unspecified lower extremity: Secondary | ICD-10-CM

## 2015-03-18 HISTORY — DX: Acute embolism and thrombosis of unspecified deep veins of unspecified lower extremity: I82.409

## 2015-04-02 DIAGNOSIS — I82401 Acute embolism and thrombosis of unspecified deep veins of right lower extremity: Secondary | ICD-10-CM | POA: Diagnosis not present

## 2015-04-02 DIAGNOSIS — I82421 Acute embolism and thrombosis of right iliac vein: Secondary | ICD-10-CM | POA: Diagnosis not present

## 2015-04-02 DIAGNOSIS — M25561 Pain in right knee: Secondary | ICD-10-CM | POA: Diagnosis not present

## 2015-04-04 DIAGNOSIS — M25561 Pain in right knee: Secondary | ICD-10-CM | POA: Diagnosis not present

## 2015-04-14 DIAGNOSIS — Z79899 Other long term (current) drug therapy: Secondary | ICD-10-CM | POA: Diagnosis not present

## 2015-04-14 DIAGNOSIS — I10 Essential (primary) hypertension: Secondary | ICD-10-CM | POA: Diagnosis not present

## 2015-04-14 DIAGNOSIS — R0789 Other chest pain: Secondary | ICD-10-CM | POA: Diagnosis not present

## 2015-04-14 DIAGNOSIS — E780 Pure hypercholesterolemia: Secondary | ICD-10-CM | POA: Diagnosis not present

## 2015-04-14 DIAGNOSIS — R079 Chest pain, unspecified: Secondary | ICD-10-CM | POA: Diagnosis not present

## 2015-04-14 DIAGNOSIS — I80201 Phlebitis and thrombophlebitis of unspecified deep vessels of right lower extremity: Secondary | ICD-10-CM | POA: Diagnosis not present

## 2015-04-14 DIAGNOSIS — M25512 Pain in left shoulder: Secondary | ICD-10-CM | POA: Diagnosis not present

## 2015-05-08 DIAGNOSIS — S8001XA Contusion of right knee, initial encounter: Secondary | ICD-10-CM | POA: Diagnosis not present

## 2015-05-08 DIAGNOSIS — I82409 Acute embolism and thrombosis of unspecified deep veins of unspecified lower extremity: Secondary | ICD-10-CM | POA: Diagnosis not present

## 2015-05-22 DIAGNOSIS — M4302 Spondylolysis, cervical region: Secondary | ICD-10-CM | POA: Diagnosis not present

## 2015-05-22 DIAGNOSIS — M47816 Spondylosis without myelopathy or radiculopathy, lumbar region: Secondary | ICD-10-CM | POA: Diagnosis not present

## 2015-06-12 DIAGNOSIS — J019 Acute sinusitis, unspecified: Secondary | ICD-10-CM | POA: Diagnosis not present

## 2015-06-12 DIAGNOSIS — B9689 Other specified bacterial agents as the cause of diseases classified elsewhere: Secondary | ICD-10-CM | POA: Diagnosis not present

## 2015-06-12 DIAGNOSIS — M79641 Pain in right hand: Secondary | ICD-10-CM | POA: Diagnosis not present

## 2015-06-12 DIAGNOSIS — L0201 Cutaneous abscess of face: Secondary | ICD-10-CM | POA: Diagnosis not present

## 2015-06-18 DIAGNOSIS — S62300A Unspecified fracture of second metacarpal bone, right hand, initial encounter for closed fracture: Secondary | ICD-10-CM | POA: Diagnosis not present

## 2015-07-08 DIAGNOSIS — I82409 Acute embolism and thrombosis of unspecified deep veins of unspecified lower extremity: Secondary | ICD-10-CM | POA: Diagnosis not present

## 2015-07-08 DIAGNOSIS — Z1389 Encounter for screening for other disorder: Secondary | ICD-10-CM | POA: Diagnosis not present

## 2015-07-08 DIAGNOSIS — Z2821 Immunization not carried out because of patient refusal: Secondary | ICD-10-CM | POA: Diagnosis not present

## 2015-07-22 DIAGNOSIS — M4316 Spondylolisthesis, lumbar region: Secondary | ICD-10-CM | POA: Diagnosis not present

## 2015-07-22 DIAGNOSIS — M47816 Spondylosis without myelopathy or radiculopathy, lumbar region: Secondary | ICD-10-CM | POA: Diagnosis not present

## 2015-07-22 DIAGNOSIS — Z981 Arthrodesis status: Secondary | ICD-10-CM | POA: Diagnosis not present

## 2015-08-07 DIAGNOSIS — M4302 Spondylolysis, cervical region: Secondary | ICD-10-CM | POA: Diagnosis not present

## 2015-08-07 DIAGNOSIS — M47816 Spondylosis without myelopathy or radiculopathy, lumbar region: Secondary | ICD-10-CM | POA: Diagnosis not present

## 2015-08-14 DIAGNOSIS — I824Z1 Acute embolism and thrombosis of unspecified deep veins of right distal lower extremity: Secondary | ICD-10-CM | POA: Diagnosis not present

## 2015-08-14 DIAGNOSIS — B9689 Other specified bacterial agents as the cause of diseases classified elsewhere: Secondary | ICD-10-CM | POA: Diagnosis not present

## 2015-08-14 DIAGNOSIS — J019 Acute sinusitis, unspecified: Secondary | ICD-10-CM | POA: Diagnosis not present

## 2015-08-18 HISTORY — PX: LITHOTRIPSY: SUR834

## 2015-10-01 DIAGNOSIS — J301 Allergic rhinitis due to pollen: Secondary | ICD-10-CM | POA: Diagnosis not present

## 2015-10-01 DIAGNOSIS — R42 Dizziness and giddiness: Secondary | ICD-10-CM | POA: Diagnosis not present

## 2015-10-01 DIAGNOSIS — B9689 Other specified bacterial agents as the cause of diseases classified elsewhere: Secondary | ICD-10-CM | POA: Diagnosis not present

## 2015-10-01 DIAGNOSIS — J019 Acute sinusitis, unspecified: Secondary | ICD-10-CM | POA: Diagnosis not present

## 2015-10-09 DIAGNOSIS — R42 Dizziness and giddiness: Secondary | ICD-10-CM | POA: Diagnosis not present

## 2015-10-09 DIAGNOSIS — Z7901 Long term (current) use of anticoagulants: Secondary | ICD-10-CM | POA: Diagnosis not present

## 2015-10-09 DIAGNOSIS — I824Z1 Acute embolism and thrombosis of unspecified deep veins of right distal lower extremity: Secondary | ICD-10-CM | POA: Diagnosis not present

## 2015-10-09 DIAGNOSIS — Z86718 Personal history of other venous thrombosis and embolism: Secondary | ICD-10-CM | POA: Diagnosis not present

## 2015-10-09 DIAGNOSIS — Z0000 Encounter for general adult medical examination without abnormal findings: Secondary | ICD-10-CM | POA: Diagnosis not present

## 2015-10-29 DIAGNOSIS — M47816 Spondylosis without myelopathy or radiculopathy, lumbar region: Secondary | ICD-10-CM | POA: Diagnosis not present

## 2015-10-29 DIAGNOSIS — M4302 Spondylolysis, cervical region: Secondary | ICD-10-CM | POA: Diagnosis not present

## 2015-10-31 DIAGNOSIS — I82401 Acute embolism and thrombosis of unspecified deep veins of right lower extremity: Secondary | ICD-10-CM | POA: Diagnosis not present

## 2015-10-31 DIAGNOSIS — I2510 Atherosclerotic heart disease of native coronary artery without angina pectoris: Secondary | ICD-10-CM | POA: Diagnosis not present

## 2015-10-31 DIAGNOSIS — E7800 Pure hypercholesterolemia, unspecified: Secondary | ICD-10-CM | POA: Diagnosis not present

## 2015-10-31 DIAGNOSIS — I10 Essential (primary) hypertension: Secondary | ICD-10-CM | POA: Diagnosis not present

## 2015-10-31 DIAGNOSIS — D6851 Activated protein C resistance: Secondary | ICD-10-CM | POA: Diagnosis not present

## 2015-10-31 DIAGNOSIS — K219 Gastro-esophageal reflux disease without esophagitis: Secondary | ICD-10-CM | POA: Diagnosis not present

## 2015-10-31 DIAGNOSIS — Z7901 Long term (current) use of anticoagulants: Secondary | ICD-10-CM | POA: Diagnosis not present

## 2016-01-06 DIAGNOSIS — M79604 Pain in right leg: Secondary | ICD-10-CM | POA: Diagnosis not present

## 2016-01-28 DIAGNOSIS — M4302 Spondylolysis, cervical region: Secondary | ICD-10-CM | POA: Diagnosis not present

## 2016-01-28 DIAGNOSIS — M47816 Spondylosis without myelopathy or radiculopathy, lumbar region: Secondary | ICD-10-CM | POA: Diagnosis not present

## 2016-02-04 DIAGNOSIS — I10 Essential (primary) hypertension: Secondary | ICD-10-CM | POA: Diagnosis not present

## 2016-02-04 DIAGNOSIS — M5137 Other intervertebral disc degeneration, lumbosacral region: Secondary | ICD-10-CM | POA: Diagnosis not present

## 2016-02-04 DIAGNOSIS — J301 Allergic rhinitis due to pollen: Secondary | ICD-10-CM | POA: Diagnosis not present

## 2016-02-04 DIAGNOSIS — G4762 Sleep related leg cramps: Secondary | ICD-10-CM | POA: Diagnosis not present

## 2016-03-01 DIAGNOSIS — L255 Unspecified contact dermatitis due to plants, except food: Secondary | ICD-10-CM | POA: Diagnosis not present

## 2016-03-01 DIAGNOSIS — M549 Dorsalgia, unspecified: Secondary | ICD-10-CM | POA: Diagnosis not present

## 2016-03-23 DIAGNOSIS — N139 Obstructive and reflux uropathy, unspecified: Secondary | ICD-10-CM | POA: Diagnosis not present

## 2016-03-23 DIAGNOSIS — M109 Gout, unspecified: Secondary | ICD-10-CM | POA: Diagnosis not present

## 2016-03-23 DIAGNOSIS — Z7901 Long term (current) use of anticoagulants: Secondary | ICD-10-CM | POA: Diagnosis not present

## 2016-03-23 DIAGNOSIS — K219 Gastro-esophageal reflux disease without esophagitis: Secondary | ICD-10-CM | POA: Diagnosis not present

## 2016-03-23 DIAGNOSIS — I10 Essential (primary) hypertension: Secondary | ICD-10-CM | POA: Diagnosis not present

## 2016-03-23 DIAGNOSIS — N201 Calculus of ureter: Secondary | ICD-10-CM | POA: Diagnosis not present

## 2016-03-23 DIAGNOSIS — N132 Hydronephrosis with renal and ureteral calculous obstruction: Secondary | ICD-10-CM | POA: Diagnosis not present

## 2016-03-23 DIAGNOSIS — Z79899 Other long term (current) drug therapy: Secondary | ICD-10-CM | POA: Diagnosis not present

## 2016-03-23 DIAGNOSIS — E7800 Pure hypercholesterolemia, unspecified: Secondary | ICD-10-CM | POA: Diagnosis not present

## 2016-03-23 DIAGNOSIS — N200 Calculus of kidney: Secondary | ICD-10-CM | POA: Diagnosis not present

## 2016-03-23 DIAGNOSIS — I82409 Acute embolism and thrombosis of unspecified deep veins of unspecified lower extremity: Secondary | ICD-10-CM | POA: Diagnosis not present

## 2016-03-24 DIAGNOSIS — N201 Calculus of ureter: Secondary | ICD-10-CM | POA: Diagnosis not present

## 2016-03-24 DIAGNOSIS — N132 Hydronephrosis with renal and ureteral calculous obstruction: Secondary | ICD-10-CM | POA: Diagnosis not present

## 2016-03-24 DIAGNOSIS — N202 Calculus of kidney with calculus of ureter: Secondary | ICD-10-CM | POA: Diagnosis not present

## 2016-03-26 DIAGNOSIS — N200 Calculus of kidney: Secondary | ICD-10-CM | POA: Diagnosis not present

## 2016-03-26 DIAGNOSIS — N201 Calculus of ureter: Secondary | ICD-10-CM | POA: Diagnosis not present

## 2016-03-27 DIAGNOSIS — Z466 Encounter for fitting and adjustment of urinary device: Secondary | ICD-10-CM | POA: Diagnosis not present

## 2016-03-27 DIAGNOSIS — N200 Calculus of kidney: Secondary | ICD-10-CM | POA: Diagnosis not present

## 2016-04-07 DIAGNOSIS — N1330 Unspecified hydronephrosis: Secondary | ICD-10-CM | POA: Diagnosis not present

## 2016-04-07 DIAGNOSIS — N200 Calculus of kidney: Secondary | ICD-10-CM | POA: Diagnosis not present

## 2016-04-07 DIAGNOSIS — N3020 Other chronic cystitis without hematuria: Secondary | ICD-10-CM | POA: Diagnosis not present

## 2016-04-07 DIAGNOSIS — N23 Unspecified renal colic: Secondary | ICD-10-CM | POA: Diagnosis not present

## 2016-04-08 DIAGNOSIS — N3090 Cystitis, unspecified without hematuria: Secondary | ICD-10-CM | POA: Diagnosis not present

## 2016-04-10 DIAGNOSIS — N200 Calculus of kidney: Secondary | ICD-10-CM | POA: Diagnosis not present

## 2016-04-10 DIAGNOSIS — Z01818 Encounter for other preprocedural examination: Secondary | ICD-10-CM | POA: Diagnosis not present

## 2016-04-17 DIAGNOSIS — N200 Calculus of kidney: Secondary | ICD-10-CM | POA: Diagnosis not present

## 2016-04-17 DIAGNOSIS — N3020 Other chronic cystitis without hematuria: Secondary | ICD-10-CM | POA: Diagnosis not present

## 2016-04-17 DIAGNOSIS — N23 Unspecified renal colic: Secondary | ICD-10-CM | POA: Diagnosis not present

## 2016-04-17 DIAGNOSIS — N1330 Unspecified hydronephrosis: Secondary | ICD-10-CM | POA: Diagnosis not present

## 2016-04-22 DIAGNOSIS — M47816 Spondylosis without myelopathy or radiculopathy, lumbar region: Secondary | ICD-10-CM | POA: Diagnosis not present

## 2016-04-22 DIAGNOSIS — M4302 Spondylolysis, cervical region: Secondary | ICD-10-CM | POA: Diagnosis not present

## 2016-05-27 DIAGNOSIS — I10 Essential (primary) hypertension: Secondary | ICD-10-CM | POA: Diagnosis not present

## 2016-05-27 DIAGNOSIS — R0789 Other chest pain: Secondary | ICD-10-CM | POA: Diagnosis not present

## 2016-05-27 DIAGNOSIS — R079 Chest pain, unspecified: Secondary | ICD-10-CM | POA: Diagnosis not present

## 2016-05-29 DIAGNOSIS — R21 Rash and other nonspecific skin eruption: Secondary | ICD-10-CM | POA: Diagnosis not present

## 2016-06-08 DIAGNOSIS — K219 Gastro-esophageal reflux disease without esophagitis: Secondary | ICD-10-CM | POA: Diagnosis not present

## 2016-06-08 DIAGNOSIS — R1011 Right upper quadrant pain: Secondary | ICD-10-CM | POA: Diagnosis not present

## 2016-06-08 DIAGNOSIS — Z1389 Encounter for screening for other disorder: Secondary | ICD-10-CM | POA: Diagnosis not present

## 2016-06-22 DIAGNOSIS — R1011 Right upper quadrant pain: Secondary | ICD-10-CM | POA: Diagnosis not present

## 2016-06-22 DIAGNOSIS — N281 Cyst of kidney, acquired: Secondary | ICD-10-CM | POA: Diagnosis not present

## 2016-06-22 DIAGNOSIS — N200 Calculus of kidney: Secondary | ICD-10-CM | POA: Diagnosis not present

## 2016-06-30 DIAGNOSIS — N3000 Acute cystitis without hematuria: Secondary | ICD-10-CM | POA: Diagnosis not present

## 2016-06-30 DIAGNOSIS — N200 Calculus of kidney: Secondary | ICD-10-CM | POA: Diagnosis not present

## 2016-06-30 DIAGNOSIS — N401 Enlarged prostate with lower urinary tract symptoms: Secondary | ICD-10-CM | POA: Diagnosis not present

## 2016-07-02 DIAGNOSIS — I700 Atherosclerosis of aorta: Secondary | ICD-10-CM | POA: Diagnosis not present

## 2016-07-02 DIAGNOSIS — N200 Calculus of kidney: Secondary | ICD-10-CM | POA: Diagnosis not present

## 2016-07-02 DIAGNOSIS — I2510 Atherosclerotic heart disease of native coronary artery without angina pectoris: Secondary | ICD-10-CM | POA: Diagnosis not present

## 2016-07-02 DIAGNOSIS — R1011 Right upper quadrant pain: Secondary | ICD-10-CM | POA: Diagnosis not present

## 2016-07-20 DIAGNOSIS — M4302 Spondylolysis, cervical region: Secondary | ICD-10-CM | POA: Diagnosis not present

## 2016-07-20 DIAGNOSIS — M47816 Spondylosis without myelopathy or radiculopathy, lumbar region: Secondary | ICD-10-CM | POA: Diagnosis not present

## 2016-07-22 DIAGNOSIS — J0101 Acute recurrent maxillary sinusitis: Secondary | ICD-10-CM | POA: Diagnosis not present

## 2016-10-05 DIAGNOSIS — R0602 Shortness of breath: Secondary | ICD-10-CM | POA: Diagnosis not present

## 2016-10-05 DIAGNOSIS — R072 Precordial pain: Secondary | ICD-10-CM | POA: Diagnosis not present

## 2016-10-05 DIAGNOSIS — R079 Chest pain, unspecified: Secondary | ICD-10-CM | POA: Diagnosis not present

## 2016-10-05 DIAGNOSIS — K219 Gastro-esophageal reflux disease without esophagitis: Secondary | ICD-10-CM | POA: Diagnosis not present

## 2016-10-05 DIAGNOSIS — R05 Cough: Secondary | ICD-10-CM | POA: Diagnosis not present

## 2016-10-06 DIAGNOSIS — T50995A Adverse effect of other drugs, medicaments and biological substances, initial encounter: Secondary | ICD-10-CM | POA: Diagnosis not present

## 2016-10-13 DIAGNOSIS — J0100 Acute maxillary sinusitis, unspecified: Secondary | ICD-10-CM | POA: Diagnosis not present

## 2016-10-13 DIAGNOSIS — J209 Acute bronchitis, unspecified: Secondary | ICD-10-CM | POA: Diagnosis not present

## 2016-10-15 DIAGNOSIS — M4302 Spondylolysis, cervical region: Secondary | ICD-10-CM | POA: Diagnosis not present

## 2016-10-15 DIAGNOSIS — M47816 Spondylosis without myelopathy or radiculopathy, lumbar region: Secondary | ICD-10-CM | POA: Diagnosis not present

## 2016-10-16 DIAGNOSIS — J0101 Acute recurrent maxillary sinusitis: Secondary | ICD-10-CM | POA: Diagnosis not present

## 2016-11-26 DIAGNOSIS — I2510 Atherosclerotic heart disease of native coronary artery without angina pectoris: Secondary | ICD-10-CM | POA: Diagnosis not present

## 2016-11-26 DIAGNOSIS — E782 Mixed hyperlipidemia: Secondary | ICD-10-CM | POA: Diagnosis not present

## 2016-11-26 DIAGNOSIS — I10 Essential (primary) hypertension: Secondary | ICD-10-CM | POA: Diagnosis not present

## 2016-11-26 DIAGNOSIS — Z79899 Other long term (current) drug therapy: Secondary | ICD-10-CM | POA: Diagnosis not present

## 2016-11-26 DIAGNOSIS — J301 Allergic rhinitis due to pollen: Secondary | ICD-10-CM | POA: Diagnosis not present

## 2016-11-26 DIAGNOSIS — R635 Abnormal weight gain: Secondary | ICD-10-CM | POA: Diagnosis not present

## 2016-11-26 DIAGNOSIS — M109 Gout, unspecified: Secondary | ICD-10-CM | POA: Diagnosis not present

## 2016-11-26 DIAGNOSIS — Z0000 Encounter for general adult medical examination without abnormal findings: Secondary | ICD-10-CM | POA: Diagnosis not present

## 2016-12-16 DIAGNOSIS — R202 Paresthesia of skin: Secondary | ICD-10-CM | POA: Diagnosis not present

## 2016-12-16 DIAGNOSIS — R252 Cramp and spasm: Secondary | ICD-10-CM | POA: Diagnosis not present

## 2016-12-31 ENCOUNTER — Encounter: Payer: Self-pay | Admitting: Neurology

## 2016-12-31 ENCOUNTER — Encounter (INDEPENDENT_AMBULATORY_CARE_PROVIDER_SITE_OTHER): Payer: Self-pay

## 2016-12-31 ENCOUNTER — Ambulatory Visit (INDEPENDENT_AMBULATORY_CARE_PROVIDER_SITE_OTHER): Payer: Medicare Other | Admitting: Neurology

## 2016-12-31 VITALS — BP 124/87 | HR 85 | Ht 73.0 in | Wt 219.0 lb

## 2016-12-31 DIAGNOSIS — R202 Paresthesia of skin: Secondary | ICD-10-CM | POA: Diagnosis not present

## 2016-12-31 NOTE — Patient Instructions (Signed)
   We will do EMG and NCV of the legs to look at nerve function.

## 2016-12-31 NOTE — Progress Notes (Signed)
Reason for visit: Right leg paresthesias  Referring physician: Dr. Deeann Dowse is a 51 y.o. male  History of present illness:  [name] is a 51 year old right-handed white male with a history of onset of tingling sensations and discomfort in the anterolateral aspect of his right thigh that began 2 months ago. The patient has a chronic history of low back pain across the back, he may have some discomfort going down the posterior regions of the lower extremities to the feet bilaterally. He indicates that since onset, the right thigh discomfort has not changed much, he believes that there is some weakness of the right leg. He has a sensation that the right knee will give way at times, he has not had any falls. He may have some cramping in the legs bilaterally at times. He denies neck pain or pain down the arms. He has not had a change in the function of the bowels or the bladder. He is sent to this office for further evaluation. The patient may have tingling sensations in the right thigh, or a sensation of water running, or burning sensations.  Past Medical History:  Diagnosis Date  . DVT (deep venous thrombosis) (HCC) 03/2015   right leg  . Fibromyalgia   . Migraine     Past Surgical History:  Procedure Laterality Date  . ABDOMINAL EXPLORATION SURGERY    . BACK SURGERY     x 5, [place], Dr Trey Sailors  . CORONARY ANGIOPLASTY WITH STENT PLACEMENT  2011  . LITHOTRIPSY  2017    Family History  Problem Relation Age of Onset  . Diabetes Mother   . Hypertension Mother   . Cancer Father   . Hypertension Father     Social history:  reports that he has never smoked. He has never used smokeless tobacco. He reports that he does not drink alcohol or use drugs.  Medications:  Prior to Admission medications   Medication Sig Start Date End Date Taking? Authorizing Provider  acetaminophen (TYLENOL) 500 MG tablet Take 1,000 mg by mouth every 6 (six) hours as needed.   Yes  [provider]  allopurinol (ZYLOPRIM) 300 MG tablet Take 300 mg by mouth daily. 12/07/16  Yes [provider]  atorvastatin (LIPITOR) 80 MG tablet 40 mg daily. 12/16/16  Yes [provider]  co-enzyme Q-10 30 MG capsule Take 30 mg by mouth 3 (three) times daily.   Yes [provider]  ELIQUIS 2.5 MG TABS tablet 2.5 mg 2 (two) times daily. 12/22/16  Yes [provider]  gabapentin (NEURONTIN) 300 MG capsule 300 mg 3 (three) times daily. 12/07/16  Yes [provider]  levocetirizine (XYZAL) 5 MG tablet 5 mg daily. 12/05/16  Yes [provider]  metaxalone (SKELAXIN) 800 MG tablet 1/2 - 1 tab 3 x daily prn 12/20/16  Yes [provider]  Oxycodone HCl 10 MG TABS 10 mg. Every 4-6 hr prn 12/10/16  Yes [provider]  pantoprazole (PROTONIX) 20 MG tablet 20 mg 2 (two) times daily. 11/06/16  Yes [provider]  propranolol (INDERAL) 20 MG tablet 20 mg daily. 12/07/16  Yes [provider]  triazolam (HALCION) 0.25 MG tablet 0.25 mg at bedtime. 12/27/16  Yes [provider]      Allergies  Allergen Reactions  . Betadine [Povidone Iodine] Shortness Of Breath, Swelling, Rash and Other (See Comments)    Burning and blisters, when applied topically.    . Shellfish  Allergy Hives, Shortness Of Breath and Rash  . Iodinated Diagnostic Agents Other (See Comments)    Patient states an MD told him he is "allergic to iodine" after several incidences of reaction to topical betadine/iodine soaps and creams.  No known documentation of IV Contrast.  Patient had Diskogram 09/2001 with contrast into discs without any documented difficulty or prep.    ROS:  Out of a complete 14 system review of symptoms, the patient complains only of the following symptoms, and all other reviewed systems are negative.  Swelling in the legs Easy bruising Joint pain, muscle cramps, aching muscles Runny nose, skin sensitivity, frequent  infections Anxiety, not enough sleep Insomnia  Blood pressure 124/87, pulse 85, height 6\' 1"  (1.854 m), weight 219 lb (99.3 kg).  Physical Exam  General: The patient is alert and cooperative at the time of the examination.  Eyes: Pupils are equal, round, and reactive to light. Discs are flat bilaterally.  Neck: The neck is supple, no carotid bruits are noted.  Respiratory: The respiratory examination is clear.  Cardiovascular: The cardiovascular examination reveals a regular rate and rhythm, no obvious murmurs or rubs are noted.  Neuromuscular: The patient has relatively full flexion of the low back.  Skin: Extremities are without significant edema.  Neurologic Exam  Mental status: The patient is alert and oriented x 3 at the time of the examination. The patient has apparent normal recent and remote memory, with an apparently normal attention span and concentration ability.  Cranial nerves: Facial symmetry is present. There is good sensation of the face to pinprick and soft touch bilaterally. The strength of the facial muscles and the muscles to head turning and shoulder shrug are normal bilaterally. Speech is well enunciated, no aphasia or dysarthria is noted. Extraocular movements are full. Visual fields are full. The tongue is midline, and the patient has symmetric elevation of the soft palate. No obvious hearing deficits are noted.  Motor: The motor testing reveals 5 over 5 strength of all 4 extremities. Good symmetric motor tone is noted throughout.  Sensory: Sensory testing is intact to pinprick, soft touch, vibration sensation, and position sense on all 4 extremities, with exception there is some minimal sensory alteration in the anterolateral aspect of the right thigh from the hip level to the knee. No evidence of extinction is noted.  Coordination: Cerebellar testing reveals good finger-nose-finger and heel-to-shin bilaterally.  Gait and station: Gait is normal. Tandem gait  is normal. Romberg is negative. No drift is seen. The patient is able to walk on heels and the toes bilaterally.  Reflexes: Deep tendon reflexes are notable for a decrease in the right biceps reflex is compared to the left, symmetric on the triceps, and symmetric at the knees. There is a decrease of the ankle jerk reflexes bilaterally. Toes are downgoing bilaterally.   Assessment/Plan:  1. 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 Chronic low back pain, bilateral lower extremity discomfort  The patient has a sensory alteration in the right thigh that is consistent with a right lateral femoral cutaneous neuropathy. The patient is overweight, which may be an etiology for this issue. The patient indicates that he has weakness in the right leg, and for this reason EMG study will be done on the right leg, nerve conduction studies on both legs. He will follow-up for the above evaluation. Treatment of the discomfort will include adequate dosing of gabapentin for pain.  Marlan Palau. Keith Willis MD 12/31/2016 8:36 AM  [facility] [address]  [place], [place] [address]  Phone [phone] Fax [phone]

## 2017-01-05 DIAGNOSIS — M47816 Spondylosis without myelopathy or radiculopathy, lumbar region: Secondary | ICD-10-CM | POA: Diagnosis not present

## 2017-01-05 DIAGNOSIS — M4302 Spondylolysis, cervical region: Secondary | ICD-10-CM | POA: Diagnosis not present

## 2017-01-14 DIAGNOSIS — J0100 Acute maxillary sinusitis, unspecified: Secondary | ICD-10-CM | POA: Diagnosis not present

## 2017-01-26 ENCOUNTER — Ambulatory Visit (INDEPENDENT_AMBULATORY_CARE_PROVIDER_SITE_OTHER): Payer: Medicare Other | Admitting: Neurology

## 2017-01-26 ENCOUNTER — Encounter: Payer: Self-pay | Admitting: *Deleted

## 2017-01-26 ENCOUNTER — Encounter: Payer: Self-pay | Admitting: Neurology

## 2017-01-26 ENCOUNTER — Ambulatory Visit (INDEPENDENT_AMBULATORY_CARE_PROVIDER_SITE_OTHER): Payer: Self-pay | Admitting: Neurology

## 2017-01-26 DIAGNOSIS — E538 Deficiency of other specified B group vitamins: Secondary | ICD-10-CM | POA: Diagnosis not present

## 2017-01-26 DIAGNOSIS — G603 Idiopathic progressive neuropathy: Secondary | ICD-10-CM | POA: Diagnosis not present

## 2017-01-26 DIAGNOSIS — R202 Paresthesia of skin: Secondary | ICD-10-CM | POA: Diagnosis not present

## 2017-01-26 DIAGNOSIS — G629 Polyneuropathy, unspecified: Secondary | ICD-10-CM

## 2017-01-26 HISTORY — DX: Polyneuropathy, unspecified: G62.9

## 2017-01-26 NOTE — Procedures (Signed)
     HISTORY: Jose [name] is a 51 year old gentleman with a history of multiple lumbosacral spine surgery procedures in the past who has complained of some numbness in the anterolateral aspect of the right thigh associated with discomfort. The patient reports some numbness in both feet, some left-sided back pain. He occasionally will have some pain down below the knee on the right. He is being evaluated for a possible neuropathy or a lumbosacral radiculopathy.  NERVE CONDUCTION STUDIES:  Nerve conduction studies were performed on the lower extremities. The distal motor latencies for the peroneal and posterior tibial nerves were within normal limits bilaterally with low motor amplitudes seen for the right peroneal nerve, normal for the left peroneal nerve and for the posterior tibial nerves bilaterally. Nerve conduction velocities for the peroneal and posterior tibial nerves were slowed bilaterally. The sensory latencies for the sural nerves were within normal limits bilaterally, prolonged for the peroneal nerves bilaterally. The F wave latencies for the posterior tibial nerves were prolonged bilaterally.  EMG STUDIES:  EMG study was performed on the right lower extremity:  The tibialis anterior muscle reveals 2 to 4K motor units with full recruitment. No fibrillations or positive waves were seen. The peroneus tertius muscle reveals 2 to 5K motor units with decreased recruitment. No fibrillations or positive waves were seen. The medial gastrocnemius muscle reveals 2 to 6K motor units with decreased recruitment. No fibrillations or positive waves were seen. The vastus lateralis muscle reveals 2 to 4K motor units with full recruitment. No fibrillations or positive waves were seen. The iliopsoas muscle reveals 2 to 4K motor units with full recruitment. No fibrillations or positive waves were seen. The biceps femoris muscle (long head) reveals 2 to 4K motor units with full recruitment. No fibrillations  or positive waves were seen. The lumbosacral paraspinal muscles were tested at 3 levels, and revealed no abnormalities of insertional activity at all 3 levels tested. There was good relaxation.   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 EMG evaluation of the right lower extremity shows evidence of a chronic stable S1 radiculopathy. No acute changes were seen. The sensory changes in the anterolateral aspect of the right leg likely represent a meralgia paresthetica.  Jose [name]. Jose Willis MD 01/26/2017 11:23 AM  [facility] [address] [place], [place] [id number]  Phone [phone] Fax [phone]

## 2017-01-26 NOTE — Progress Notes (Signed)
The patient comes in today for EMG and nerve conduction study evaluation. The right anterolateral aspect numbness is likely a meralgia paresthetica. The nerve conduction study shows a mild axonal peripheral neuropathy. EMG of the right leg shows evidence of a chronic stable S1 radiculopathy. The patient has had a prior L5-S1 fusion.  We will check blood work today to further evaluate the peripheral neuropathy. The patient does report some numbness in the feet.

## 2017-01-26 NOTE — Progress Notes (Signed)
Please refer to EMG and nerve conduction study procedure note. 

## 2017-01-26 NOTE — Progress Notes (Signed)
Patient in office for EMG/NCS. Patient requested med list be updated. Made updates per pt request.

## 2017-01-28 LAB — VITAMIN B12: VITAMIN B 12: 1013 pg/mL (ref 232–1245)

## 2017-01-28 LAB — ANA W/REFLEX: ANA: NEGATIVE

## 2017-01-28 LAB — B. BURGDORFI ANTIBODIES: Lyme IgG/IgM Ab: <0.91 {ISR} (ref 0.00–0.90)

## 2017-01-28 LAB — MULTIPLE MYELOMA PANEL, SERUM
ALBUMIN/GLOB SERPL: 1.5 (ref 0.7–1.7)
ALPHA2 GLOB SERPL ELPH-MCNC: 0.7 g/dL (ref 0.4–1.0)
Albumin SerPl Elph-Mcnc: 4.2 g/dL (ref 2.9–4.4)
Alpha 1: 0.2 g/dL (ref 0.0–0.4)
B-GLOBULIN SERPL ELPH-MCNC: 1.2 g/dL (ref 0.7–1.3)
Gamma Glob SerPl Elph-Mcnc: 0.8 g/dL (ref 0.4–1.8)
Globulin, Total: 2.9 g/dL (ref 2.2–3.9)
IGG (IMMUNOGLOBIN G), SERUM: 781 mg/dL (ref 700–1600)
IgA/Immunoglobulin A, Serum: 125 mg/dL (ref 90–386)
IgM (Immunoglobulin M), Srm: 23 mg/dL (ref 20–172)
TOTAL PROTEIN: 7.1 g/dL (ref 6.0–8.5)

## 2017-01-28 LAB — RHEUMATOID FACTOR: Rhuematoid fact SerPl-aCnc: <10 IU/mL (ref 0.0–13.9)

## 2017-01-28 LAB — ANGIOTENSIN CONVERTING ENZYME: Angio Convert Enzyme: 55 U/L (ref 14–82)

## 2017-01-28 LAB — SEDIMENTATION RATE: Sed Rate: 2 mm/hr (ref 0–30)

## 2017-01-29 ENCOUNTER — Telehealth: Payer: Self-pay | Admitting: *Deleted

## 2017-01-29 NOTE — Telephone Encounter (Signed)
Called and LVM for pt about unremarkable labs per CW,MD note. Gave [facility] phone number if he has further questions or concerns.  

## 2017-01-29 NOTE — Telephone Encounter (Signed)
-----   Message from York Spanielharles K Willis, MD sent at 01/28/2017  4:40 PM EDT -----   The blood work results are unremarkable. Please call the patient.  ----- Message ----- From: Nell RangeInterface, [facility] Lab Results In Sent: 01/27/2017   7:44 AM To: York Spanielharles K Willis, MD

## 2017-02-08 ENCOUNTER — Telehealth: Payer: Self-pay | Admitting: Neurology

## 2017-02-08 NOTE — Telephone Encounter (Signed)
I called the patient, talk with the wife. I have gone over the results of the EMG with the patient. I believe most of his pain is coming from meralgia paresthetica, he does have an overlying generalized peripheral neuropathy. The patient has evidence of chronic S1 radiculopathy on the right.  The patient is on gabapentin taking 300 mg 3 times daily, he may go up on the dose if he is tolerating this.

## 2017-02-08 NOTE — Telephone Encounter (Signed)
Patients wife call requesting NCV/EMG results.  Please call

## 2017-02-10 ENCOUNTER — Telehealth: Payer: Self-pay | Admitting: Neurology

## 2017-02-10 NOTE — Telephone Encounter (Signed)
Brandy with Dr. Temple Pacinioy's office is calling to confirm dosage for gabapentin (NEURONTIN) 300 MG capsule. The patient says he take 3600mg  a day. Gearldine BienenstockBrandy can be reached at [phone]. If VM please leave a message.

## 2017-02-10 NOTE — Telephone Encounter (Signed)
I called the office of Dr. Channing Muttersoy, I left a message. The patient through our records is on 300 mg gabapentin 3 times daily. He could go up on the dose significantly if he is still having discomfort, I'm not sure who is giving him the prescription for the gabapentin.

## 2017-03-18 DIAGNOSIS — M47816 Spondylosis without myelopathy or radiculopathy, lumbar region: Secondary | ICD-10-CM | POA: Diagnosis not present

## 2017-03-18 DIAGNOSIS — M4302 Spondylolysis, cervical region: Secondary | ICD-10-CM | POA: Diagnosis not present

## 2017-04-22 DIAGNOSIS — Z6829 Body mass index (BMI) 29.0-29.9, adult: Secondary | ICD-10-CM | POA: Diagnosis not present

## 2017-04-22 DIAGNOSIS — K604 Rectal fistula: Secondary | ICD-10-CM | POA: Diagnosis not present

## 2017-06-15 DIAGNOSIS — K6289 Other specified diseases of anus and rectum: Secondary | ICD-10-CM | POA: Diagnosis not present

## 2017-06-15 DIAGNOSIS — K602 Anal fissure, unspecified: Secondary | ICD-10-CM | POA: Diagnosis not present

## 2017-06-22 DIAGNOSIS — M47816 Spondylosis without myelopathy or radiculopathy, lumbar region: Secondary | ICD-10-CM | POA: Diagnosis not present

## 2017-06-29 DIAGNOSIS — R9431 Abnormal electrocardiogram [ECG] [EKG]: Secondary | ICD-10-CM | POA: Diagnosis not present

## 2017-07-06 DIAGNOSIS — R079 Chest pain, unspecified: Secondary | ICD-10-CM | POA: Diagnosis not present

## 2017-07-06 DIAGNOSIS — R000 Tachycardia, unspecified: Secondary | ICD-10-CM | POA: Diagnosis not present

## 2017-07-24 DIAGNOSIS — J069 Acute upper respiratory infection, unspecified: Secondary | ICD-10-CM | POA: Diagnosis not present

## 2017-07-24 DIAGNOSIS — J0100 Acute maxillary sinusitis, unspecified: Secondary | ICD-10-CM | POA: Diagnosis not present

## 2017-08-03 DIAGNOSIS — R42 Dizziness and giddiness: Secondary | ICD-10-CM | POA: Diagnosis not present

## 2017-08-03 DIAGNOSIS — Z6829 Body mass index (BMI) 29.0-29.9, adult: Secondary | ICD-10-CM | POA: Diagnosis not present

## 2017-08-03 DIAGNOSIS — J329 Chronic sinusitis, unspecified: Secondary | ICD-10-CM | POA: Diagnosis not present

## 2017-08-03 DIAGNOSIS — J301 Allergic rhinitis due to pollen: Secondary | ICD-10-CM | POA: Diagnosis not present

## 2017-08-23 DIAGNOSIS — K921 Melena: Secondary | ICD-10-CM | POA: Diagnosis not present

## 2017-08-23 DIAGNOSIS — K602 Anal fissure, unspecified: Secondary | ICD-10-CM | POA: Diagnosis not present

## 2017-09-20 DIAGNOSIS — M4716 Other spondylosis with myelopathy, lumbar region: Secondary | ICD-10-CM | POA: Diagnosis not present

## 2017-11-05 DIAGNOSIS — I951 Orthostatic hypotension: Secondary | ICD-10-CM | POA: Diagnosis not present

## 2017-11-05 DIAGNOSIS — M47812 Spondylosis without myelopathy or radiculopathy, cervical region: Secondary | ICD-10-CM | POA: Diagnosis not present

## 2017-11-05 DIAGNOSIS — R51 Headache: Secondary | ICD-10-CM | POA: Diagnosis not present

## 2017-11-05 DIAGNOSIS — Z6829 Body mass index (BMI) 29.0-29.9, adult: Secondary | ICD-10-CM | POA: Diagnosis not present

## 2017-11-05 DIAGNOSIS — E782 Mixed hyperlipidemia: Secondary | ICD-10-CM | POA: Diagnosis not present

## 2017-11-30 DIAGNOSIS — M25519 Pain in unspecified shoulder: Secondary | ICD-10-CM | POA: Diagnosis not present

## 2017-12-17 DIAGNOSIS — M4716 Other spondylosis with myelopathy, lumbar region: Secondary | ICD-10-CM | POA: Diagnosis not present

## 2017-12-17 DIAGNOSIS — M47816 Spondylosis without myelopathy or radiculopathy, lumbar region: Secondary | ICD-10-CM | POA: Diagnosis not present

## 2018-01-14 DIAGNOSIS — M109 Gout, unspecified: Secondary | ICD-10-CM | POA: Diagnosis not present

## 2018-01-14 DIAGNOSIS — M79646 Pain in unspecified finger(s): Secondary | ICD-10-CM | POA: Diagnosis not present

## 2018-01-14 DIAGNOSIS — Z6829 Body mass index (BMI) 29.0-29.9, adult: Secondary | ICD-10-CM | POA: Diagnosis not present

## 2018-02-24 DIAGNOSIS — Z6829 Body mass index (BMI) 29.0-29.9, adult: Secondary | ICD-10-CM | POA: Diagnosis not present

## 2018-02-24 DIAGNOSIS — L0292 Furuncle, unspecified: Secondary | ICD-10-CM | POA: Diagnosis not present

## 2018-04-01 DIAGNOSIS — M47816 Spondylosis without myelopathy or radiculopathy, lumbar region: Secondary | ICD-10-CM | POA: Diagnosis not present

## 2018-04-13 DIAGNOSIS — H66009 Acute suppurative otitis media without spontaneous rupture of ear drum, unspecified ear: Secondary | ICD-10-CM | POA: Diagnosis not present

## 2018-04-21 DIAGNOSIS — L080 Pyoderma: Secondary | ICD-10-CM | POA: Diagnosis not present

## 2018-04-21 DIAGNOSIS — Z6829 Body mass index (BMI) 29.0-29.9, adult: Secondary | ICD-10-CM | POA: Diagnosis not present

## 2018-04-21 DIAGNOSIS — L209 Atopic dermatitis, unspecified: Secondary | ICD-10-CM | POA: Diagnosis not present

## 2018-04-25 DIAGNOSIS — S20219A Contusion of unspecified front wall of thorax, initial encounter: Secondary | ICD-10-CM | POA: Diagnosis not present

## 2018-04-27 DIAGNOSIS — Z79899 Other long term (current) drug therapy: Secondary | ICD-10-CM | POA: Diagnosis not present

## 2018-04-27 DIAGNOSIS — R55 Syncope and collapse: Secondary | ICD-10-CM | POA: Diagnosis not present

## 2018-04-27 DIAGNOSIS — R1012 Left upper quadrant pain: Secondary | ICD-10-CM | POA: Diagnosis not present

## 2018-04-27 DIAGNOSIS — I10 Essential (primary) hypertension: Secondary | ICD-10-CM | POA: Diagnosis not present

## 2018-04-27 DIAGNOSIS — R0781 Pleurodynia: Secondary | ICD-10-CM | POA: Diagnosis not present

## 2018-04-27 DIAGNOSIS — S299XXA Unspecified injury of thorax, initial encounter: Secondary | ICD-10-CM | POA: Diagnosis not present

## 2018-04-27 DIAGNOSIS — I82409 Acute embolism and thrombosis of unspecified deep veins of unspecified lower extremity: Secondary | ICD-10-CM

## 2018-04-27 DIAGNOSIS — E785 Hyperlipidemia, unspecified: Secondary | ICD-10-CM | POA: Diagnosis not present

## 2018-04-27 DIAGNOSIS — D72829 Elevated white blood cell count, unspecified: Secondary | ICD-10-CM | POA: Diagnosis not present

## 2018-04-27 DIAGNOSIS — Z86718 Personal history of other venous thrombosis and embolism: Secondary | ICD-10-CM | POA: Diagnosis not present

## 2018-04-27 DIAGNOSIS — I2510 Atherosclerotic heart disease of native coronary artery without angina pectoris: Secondary | ICD-10-CM | POA: Diagnosis not present

## 2018-04-27 DIAGNOSIS — I160 Hypertensive urgency: Secondary | ICD-10-CM | POA: Diagnosis not present

## 2018-04-27 DIAGNOSIS — S3991XA Unspecified injury of abdomen, initial encounter: Secondary | ICD-10-CM | POA: Diagnosis not present

## 2018-04-27 DIAGNOSIS — Z7901 Long term (current) use of anticoagulants: Secondary | ICD-10-CM | POA: Diagnosis not present

## 2018-04-28 DIAGNOSIS — R0781 Pleurodynia: Secondary | ICD-10-CM | POA: Diagnosis not present

## 2018-04-28 DIAGNOSIS — R55 Syncope and collapse: Secondary | ICD-10-CM

## 2018-05-03 DIAGNOSIS — R55 Syncope and collapse: Secondary | ICD-10-CM | POA: Diagnosis not present

## 2018-05-03 DIAGNOSIS — Z6829 Body mass index (BMI) 29.0-29.9, adult: Secondary | ICD-10-CM | POA: Diagnosis not present

## 2018-05-03 DIAGNOSIS — F411 Generalized anxiety disorder: Secondary | ICD-10-CM | POA: Diagnosis not present

## 2018-05-03 DIAGNOSIS — S20212S Contusion of left front wall of thorax, sequela: Secondary | ICD-10-CM | POA: Diagnosis not present

## 2018-05-31 DIAGNOSIS — Z86718 Personal history of other venous thrombosis and embolism: Secondary | ICD-10-CM

## 2018-05-31 DIAGNOSIS — D6851 Activated protein C resistance: Secondary | ICD-10-CM

## 2018-05-31 DIAGNOSIS — Z7901 Long term (current) use of anticoagulants: Secondary | ICD-10-CM | POA: Diagnosis not present

## 2018-06-08 DIAGNOSIS — Z2821 Immunization not carried out because of patient refusal: Secondary | ICD-10-CM | POA: Diagnosis not present

## 2018-06-08 DIAGNOSIS — H1010 Acute atopic conjunctivitis, unspecified eye: Secondary | ICD-10-CM | POA: Diagnosis not present

## 2018-06-08 DIAGNOSIS — L309 Dermatitis, unspecified: Secondary | ICD-10-CM | POA: Diagnosis not present

## 2018-06-08 DIAGNOSIS — I10 Essential (primary) hypertension: Secondary | ICD-10-CM | POA: Diagnosis not present

## 2018-07-12 DIAGNOSIS — M4716 Other spondylosis with myelopathy, lumbar region: Secondary | ICD-10-CM | POA: Diagnosis not present

## 2018-07-12 DIAGNOSIS — M47816 Spondylosis without myelopathy or radiculopathy, lumbar region: Secondary | ICD-10-CM | POA: Diagnosis not present

## 2018-07-19 DIAGNOSIS — Z6829 Body mass index (BMI) 29.0-29.9, adult: Secondary | ICD-10-CM | POA: Diagnosis not present

## 2018-07-19 DIAGNOSIS — J40 Bronchitis, not specified as acute or chronic: Secondary | ICD-10-CM | POA: Diagnosis not present

## 2018-07-19 DIAGNOSIS — J329 Chronic sinusitis, unspecified: Secondary | ICD-10-CM | POA: Diagnosis not present

## 2018-10-12 DIAGNOSIS — M4716 Other spondylosis with myelopathy, lumbar region: Secondary | ICD-10-CM | POA: Diagnosis not present

## 2018-10-12 DIAGNOSIS — M47816 Spondylosis without myelopathy or radiculopathy, lumbar region: Secondary | ICD-10-CM | POA: Diagnosis not present

## 2018-12-19 DIAGNOSIS — Z0000 Encounter for general adult medical examination without abnormal findings: Secondary | ICD-10-CM | POA: Diagnosis not present

## 2019-02-17 DIAGNOSIS — M79604 Pain in right leg: Secondary | ICD-10-CM | POA: Diagnosis not present

## 2019-02-17 DIAGNOSIS — G8929 Other chronic pain: Secondary | ICD-10-CM | POA: Diagnosis not present

## 2019-02-17 DIAGNOSIS — M961 Postlaminectomy syndrome, not elsewhere classified: Secondary | ICD-10-CM | POA: Diagnosis not present

## 2019-02-17 DIAGNOSIS — M25561 Pain in right knee: Secondary | ICD-10-CM | POA: Diagnosis not present

## 2019-02-17 DIAGNOSIS — M545 Low back pain: Secondary | ICD-10-CM | POA: Diagnosis not present

## 2019-03-14 DIAGNOSIS — M79604 Pain in right leg: Secondary | ICD-10-CM | POA: Diagnosis not present

## 2019-03-14 DIAGNOSIS — M545 Low back pain: Secondary | ICD-10-CM | POA: Diagnosis not present

## 2019-03-14 DIAGNOSIS — M961 Postlaminectomy syndrome, not elsewhere classified: Secondary | ICD-10-CM | POA: Diagnosis not present

## 2019-03-14 DIAGNOSIS — G8929 Other chronic pain: Secondary | ICD-10-CM | POA: Diagnosis not present

## 2019-03-14 DIAGNOSIS — M25561 Pain in right knee: Secondary | ICD-10-CM | POA: Diagnosis not present

## 2019-04-10 DIAGNOSIS — M7989 Other specified soft tissue disorders: Secondary | ICD-10-CM | POA: Diagnosis not present

## 2019-04-10 DIAGNOSIS — N529 Male erectile dysfunction, unspecified: Secondary | ICD-10-CM | POA: Diagnosis not present

## 2019-04-10 DIAGNOSIS — E538 Deficiency of other specified B group vitamins: Secondary | ICD-10-CM | POA: Diagnosis not present

## 2019-04-10 DIAGNOSIS — Z86718 Personal history of other venous thrombosis and embolism: Secondary | ICD-10-CM | POA: Diagnosis not present

## 2019-04-10 DIAGNOSIS — G4700 Insomnia, unspecified: Secondary | ICD-10-CM | POA: Diagnosis not present

## 2019-04-11 DIAGNOSIS — I82441 Acute embolism and thrombosis of right tibial vein: Secondary | ICD-10-CM | POA: Diagnosis not present

## 2019-04-11 DIAGNOSIS — I82431 Acute embolism and thrombosis of right popliteal vein: Secondary | ICD-10-CM | POA: Diagnosis not present

## 2019-04-11 DIAGNOSIS — M7989 Other specified soft tissue disorders: Secondary | ICD-10-CM | POA: Diagnosis not present

## 2019-04-12 DIAGNOSIS — M545 Low back pain: Secondary | ICD-10-CM | POA: Diagnosis not present

## 2019-04-12 DIAGNOSIS — M25561 Pain in right knee: Secondary | ICD-10-CM | POA: Diagnosis not present

## 2019-04-12 DIAGNOSIS — M961 Postlaminectomy syndrome, not elsewhere classified: Secondary | ICD-10-CM | POA: Diagnosis not present

## 2019-04-12 DIAGNOSIS — M79604 Pain in right leg: Secondary | ICD-10-CM | POA: Diagnosis not present

## 2019-04-12 DIAGNOSIS — M542 Cervicalgia: Secondary | ICD-10-CM | POA: Diagnosis not present

## 2019-04-12 DIAGNOSIS — G8929 Other chronic pain: Secondary | ICD-10-CM | POA: Diagnosis not present

## 2019-04-12 DIAGNOSIS — M25511 Pain in right shoulder: Secondary | ICD-10-CM | POA: Diagnosis not present

## 2019-04-12 DIAGNOSIS — M546 Pain in thoracic spine: Secondary | ICD-10-CM | POA: Diagnosis not present

## 2019-05-17 DIAGNOSIS — M545 Low back pain: Secondary | ICD-10-CM | POA: Diagnosis not present

## 2019-05-17 DIAGNOSIS — G8929 Other chronic pain: Secondary | ICD-10-CM | POA: Diagnosis not present

## 2019-05-17 DIAGNOSIS — M961 Postlaminectomy syndrome, not elsewhere classified: Secondary | ICD-10-CM | POA: Diagnosis not present

## 2019-05-17 DIAGNOSIS — M25561 Pain in right knee: Secondary | ICD-10-CM | POA: Diagnosis not present

## 2019-05-17 DIAGNOSIS — M79604 Pain in right leg: Secondary | ICD-10-CM | POA: Diagnosis not present

## 2019-05-22 DIAGNOSIS — Z9181 History of falling: Secondary | ICD-10-CM | POA: Diagnosis not present

## 2019-05-22 DIAGNOSIS — Z1331 Encounter for screening for depression: Secondary | ICD-10-CM | POA: Diagnosis not present

## 2019-05-22 DIAGNOSIS — I10 Essential (primary) hypertension: Secondary | ICD-10-CM | POA: Diagnosis not present

## 2019-05-22 DIAGNOSIS — N529 Male erectile dysfunction, unspecified: Secondary | ICD-10-CM | POA: Diagnosis not present

## 2019-05-22 DIAGNOSIS — Z6832 Body mass index (BMI) 32.0-32.9, adult: Secondary | ICD-10-CM | POA: Diagnosis not present

## 2019-06-13 DIAGNOSIS — M5440 Lumbago with sciatica, unspecified side: Secondary | ICD-10-CM | POA: Diagnosis not present

## 2019-06-15 DIAGNOSIS — M961 Postlaminectomy syndrome, not elsewhere classified: Secondary | ICD-10-CM | POA: Diagnosis not present

## 2019-06-15 DIAGNOSIS — M79604 Pain in right leg: Secondary | ICD-10-CM | POA: Diagnosis not present

## 2019-06-15 DIAGNOSIS — G8929 Other chronic pain: Secondary | ICD-10-CM | POA: Diagnosis not present

## 2019-06-15 DIAGNOSIS — M545 Low back pain: Secondary | ICD-10-CM | POA: Diagnosis not present

## 2019-06-15 DIAGNOSIS — M25561 Pain in right knee: Secondary | ICD-10-CM | POA: Diagnosis not present

## 2019-06-19 DIAGNOSIS — M5440 Lumbago with sciatica, unspecified side: Secondary | ICD-10-CM | POA: Diagnosis not present

## 2019-06-19 DIAGNOSIS — M545 Low back pain: Secondary | ICD-10-CM | POA: Diagnosis not present

## 2019-06-19 DIAGNOSIS — M48061 Spinal stenosis, lumbar region without neurogenic claudication: Secondary | ICD-10-CM | POA: Diagnosis not present

## 2019-06-22 DIAGNOSIS — R1032 Left lower quadrant pain: Secondary | ICD-10-CM | POA: Diagnosis not present

## 2019-06-22 DIAGNOSIS — R109 Unspecified abdominal pain: Secondary | ICD-10-CM | POA: Diagnosis not present

## 2019-06-27 DIAGNOSIS — M47816 Spondylosis without myelopathy or radiculopathy, lumbar region: Secondary | ICD-10-CM | POA: Diagnosis not present

## 2019-06-27 DIAGNOSIS — M5440 Lumbago with sciatica, unspecified side: Secondary | ICD-10-CM | POA: Diagnosis not present

## 2019-06-29 DIAGNOSIS — M48061 Spinal stenosis, lumbar region without neurogenic claudication: Secondary | ICD-10-CM | POA: Diagnosis not present

## 2019-06-29 DIAGNOSIS — M5440 Lumbago with sciatica, unspecified side: Secondary | ICD-10-CM | POA: Diagnosis not present

## 2019-06-29 DIAGNOSIS — M5126 Other intervertebral disc displacement, lumbar region: Secondary | ICD-10-CM | POA: Diagnosis not present

## 2019-07-12 DIAGNOSIS — G8929 Other chronic pain: Secondary | ICD-10-CM | POA: Diagnosis not present

## 2019-07-12 DIAGNOSIS — M79604 Pain in right leg: Secondary | ICD-10-CM | POA: Diagnosis not present

## 2019-07-12 DIAGNOSIS — M545 Low back pain: Secondary | ICD-10-CM | POA: Diagnosis not present

## 2019-07-12 DIAGNOSIS — M961 Postlaminectomy syndrome, not elsewhere classified: Secondary | ICD-10-CM | POA: Diagnosis not present

## 2019-07-12 DIAGNOSIS — M25561 Pain in right knee: Secondary | ICD-10-CM | POA: Diagnosis not present

## 2019-07-18 DIAGNOSIS — M5440 Lumbago with sciatica, unspecified side: Secondary | ICD-10-CM | POA: Diagnosis not present

## 2019-07-18 DIAGNOSIS — M47816 Spondylosis without myelopathy or radiculopathy, lumbar region: Secondary | ICD-10-CM | POA: Diagnosis not present

## 2019-07-18 DIAGNOSIS — Z6831 Body mass index (BMI) 31.0-31.9, adult: Secondary | ICD-10-CM | POA: Diagnosis not present

## 2019-07-18 DIAGNOSIS — M48062 Spinal stenosis, lumbar region with neurogenic claudication: Secondary | ICD-10-CM | POA: Diagnosis not present

## 2019-07-18 DIAGNOSIS — M961 Postlaminectomy syndrome, not elsewhere classified: Secondary | ICD-10-CM | POA: Diagnosis not present

## 2019-07-18 DIAGNOSIS — I10 Essential (primary) hypertension: Secondary | ICD-10-CM | POA: Diagnosis not present

## 2019-07-18 DIAGNOSIS — Z7901 Long term (current) use of anticoagulants: Secondary | ICD-10-CM | POA: Diagnosis not present

## 2019-07-18 DIAGNOSIS — I82501 Chronic embolism and thrombosis of unspecified deep veins of right lower extremity: Secondary | ICD-10-CM | POA: Diagnosis not present

## 2020-08-28 DIAGNOSIS — M1A09X0 Idiopathic chronic gout, multiple sites, without tophus (tophi): Secondary | ICD-10-CM | POA: Diagnosis not present

## 2020-08-28 DIAGNOSIS — G4700 Insomnia, unspecified: Secondary | ICD-10-CM | POA: Diagnosis not present

## 2020-08-28 DIAGNOSIS — R69 Illness, unspecified: Secondary | ICD-10-CM | POA: Diagnosis not present

## 2020-08-28 DIAGNOSIS — D510 Vitamin B12 deficiency anemia due to intrinsic factor deficiency: Secondary | ICD-10-CM | POA: Diagnosis not present

## 2020-08-28 DIAGNOSIS — K2960 Other gastritis without bleeding: Secondary | ICD-10-CM | POA: Diagnosis not present

## 2020-08-28 DIAGNOSIS — I10 Essential (primary) hypertension: Secondary | ICD-10-CM | POA: Diagnosis not present

## 2020-08-28 DIAGNOSIS — M5137 Other intervertebral disc degeneration, lumbosacral region: Secondary | ICD-10-CM | POA: Diagnosis not present

## 2020-08-28 DIAGNOSIS — M961 Postlaminectomy syndrome, not elsewhere classified: Secondary | ICD-10-CM | POA: Diagnosis not present

## 2020-08-28 DIAGNOSIS — D6851 Activated protein C resistance: Secondary | ICD-10-CM | POA: Diagnosis not present

## 2020-08-28 DIAGNOSIS — E782 Mixed hyperlipidemia: Secondary | ICD-10-CM | POA: Diagnosis not present

## 2020-08-30 DIAGNOSIS — D510 Vitamin B12 deficiency anemia due to intrinsic factor deficiency: Secondary | ICD-10-CM | POA: Diagnosis not present

## 2020-08-30 DIAGNOSIS — D519 Vitamin B12 deficiency anemia, unspecified: Secondary | ICD-10-CM | POA: Diagnosis not present

## 2020-09-05 DIAGNOSIS — H1045 Other chronic allergic conjunctivitis: Secondary | ICD-10-CM | POA: Diagnosis not present

## 2020-09-05 DIAGNOSIS — D6851 Activated protein C resistance: Secondary | ICD-10-CM | POA: Diagnosis not present

## 2020-09-05 DIAGNOSIS — E663 Overweight: Secondary | ICD-10-CM | POA: Diagnosis not present

## 2020-09-05 DIAGNOSIS — Z6828 Body mass index (BMI) 28.0-28.9, adult: Secondary | ICD-10-CM | POA: Diagnosis not present

## 2020-09-05 DIAGNOSIS — J301 Allergic rhinitis due to pollen: Secondary | ICD-10-CM | POA: Diagnosis not present

## 2020-09-10 DIAGNOSIS — G894 Chronic pain syndrome: Secondary | ICD-10-CM | POA: Diagnosis not present

## 2020-09-10 DIAGNOSIS — M961 Postlaminectomy syndrome, not elsewhere classified: Secondary | ICD-10-CM | POA: Diagnosis not present

## 2020-09-10 DIAGNOSIS — Z79891 Long term (current) use of opiate analgesic: Secondary | ICD-10-CM | POA: Diagnosis not present

## 2020-09-10 DIAGNOSIS — M25561 Pain in right knee: Secondary | ICD-10-CM | POA: Diagnosis not present

## 2020-09-10 DIAGNOSIS — F1120 Opioid dependence, uncomplicated: Secondary | ICD-10-CM | POA: Diagnosis not present

## 2020-09-10 DIAGNOSIS — M79604 Pain in right leg: Secondary | ICD-10-CM | POA: Diagnosis not present

## 2020-09-10 DIAGNOSIS — M545 Low back pain, unspecified: Secondary | ICD-10-CM | POA: Diagnosis not present

## 2020-09-10 DIAGNOSIS — G8929 Other chronic pain: Secondary | ICD-10-CM | POA: Diagnosis not present

## 2020-09-17 DIAGNOSIS — H1045 Other chronic allergic conjunctivitis: Secondary | ICD-10-CM | POA: Diagnosis not present

## 2020-09-17 DIAGNOSIS — Z6829 Body mass index (BMI) 29.0-29.9, adult: Secondary | ICD-10-CM | POA: Diagnosis not present

## 2020-09-17 DIAGNOSIS — J301 Allergic rhinitis due to pollen: Secondary | ICD-10-CM | POA: Diagnosis not present

## 2020-10-08 DIAGNOSIS — G8929 Other chronic pain: Secondary | ICD-10-CM | POA: Diagnosis not present

## 2020-10-08 DIAGNOSIS — M961 Postlaminectomy syndrome, not elsewhere classified: Secondary | ICD-10-CM | POA: Diagnosis not present

## 2020-10-08 DIAGNOSIS — M25561 Pain in right knee: Secondary | ICD-10-CM | POA: Diagnosis not present

## 2020-10-08 DIAGNOSIS — Z79891 Long term (current) use of opiate analgesic: Secondary | ICD-10-CM | POA: Diagnosis not present

## 2020-10-08 DIAGNOSIS — G894 Chronic pain syndrome: Secondary | ICD-10-CM | POA: Diagnosis not present

## 2020-10-08 DIAGNOSIS — M79604 Pain in right leg: Secondary | ICD-10-CM | POA: Diagnosis not present

## 2020-10-08 DIAGNOSIS — F1120 Opioid dependence, uncomplicated: Secondary | ICD-10-CM | POA: Diagnosis not present

## 2020-10-08 DIAGNOSIS — M545 Low back pain, unspecified: Secondary | ICD-10-CM | POA: Diagnosis not present

## 2020-10-10 ENCOUNTER — Encounter: Payer: Self-pay | Admitting: Allergy and Immunology

## 2020-10-10 ENCOUNTER — Encounter (INDEPENDENT_AMBULATORY_CARE_PROVIDER_SITE_OTHER): Payer: Self-pay

## 2020-10-10 ENCOUNTER — Ambulatory Visit (INDEPENDENT_AMBULATORY_CARE_PROVIDER_SITE_OTHER): Payer: Medicare HMO | Admitting: Allergy and Immunology

## 2020-10-10 ENCOUNTER — Other Ambulatory Visit: Payer: Self-pay

## 2020-10-10 VITALS — BP 132/102 | HR 100 | Resp 20 | Ht 72.9 in | Wt 224.0 lb

## 2020-10-10 DIAGNOSIS — H5789 Other specified disorders of eye and adnexa: Secondary | ICD-10-CM

## 2020-10-10 DIAGNOSIS — H1013 Acute atopic conjunctivitis, bilateral: Secondary | ICD-10-CM | POA: Diagnosis not present

## 2020-10-10 DIAGNOSIS — T7800XA Anaphylactic reaction due to unspecified food, initial encounter: Secondary | ICD-10-CM

## 2020-10-10 DIAGNOSIS — Z79899 Other long term (current) drug therapy: Secondary | ICD-10-CM

## 2020-10-10 DIAGNOSIS — J3089 Other allergic rhinitis: Secondary | ICD-10-CM | POA: Diagnosis not present

## 2020-10-10 DIAGNOSIS — H04123 Dry eye syndrome of bilateral lacrimal glands: Secondary | ICD-10-CM | POA: Diagnosis not present

## 2020-10-10 MED ORDER — LOSARTAN POTASSIUM 50 MG PO TABS: 50.0000 mg | ORAL_TABLET | Freq: Every day | ORAL | 5 refills | Status: AC

## 2020-10-10 MED ORDER — MONTELUKAST SODIUM 10 MG PO TABS: 10.0000 mg | ORAL_TABLET | Freq: Every day | ORAL | 5 refills | Status: AC

## 2020-10-10 MED ORDER — IPRATROPIUM BROMIDE 0.06 % NA SOLN: NASAL | 5 refills | Status: DC

## 2020-10-10 MED ORDER — EPINEPHRINE 0.3 MG/0.3ML IJ SOAJ: INTRAMUSCULAR | 3 refills | Status: AC

## 2020-10-10 MED ORDER — OLOPATADINE HCL 0.2 % OP SOLN: OPHTHALMIC | 5 refills | Status: AC

## 2020-10-10 NOTE — Patient Instructions (Addendum)
  1.  Allergen avoidance measures -house dust mite, dog, ragweed  2.  Treat and prevent inflammation:   A. Flonase sensmyst - 2 sprays ecah nostril I time per day  B. Montelukast 10 mg - 1 tablet 1 time per day  3.  If needed:   A.  Systane eyedrops -1 drop each eye multiple times per day  B.  Pataday -1 drop each eye 1 time per day  C.  Nasal ipratropium 0.06% -2 sprays each nostril every 6 hours to dry nose  D.  EpiPen, Benadryl, MD/ER evaluation for allergic reaction  4.  Replace lisinopril with losartan 50 mg - 1 tablet 1 time per day  5.  Do not use any oral antihistamines at this point  6.  Obtain blood - CBC w/d, TSH, FT4, Thyroid peroxidase, C4, shellfish panel, fish panel  7.  Obtain exam with eye doctor to examine for dry eye syndrome  8. Return to clinic in 4 weeks or earlier if problem

## 2020-10-10 NOTE — Progress Notes (Signed)
[place]   Dear Dr. Casper HarrisonStreet,  Thank you for referring Jose [name] to the [facility] [place] on 10/10/2020.   Below is a summation of this patient's evaluation and recommendations.  Thank you for your referral. I will keep you informed about this patient's response to treatment.   If you have any questions please do not hesitate to contact me.   Sincerely,  Jessica PriestEric J. Kenzey Birkland, MD Allergy / Immunology [place] [facility]   ______________________________________________________________________    NEW PATIENT NOTE  Referring Provider: Street, Stephanie Couphristopher M, * Primary Provider: Street, Stephanie Couphristopher M, MD Date of office visit: 10/10/2020    Subjective:   Chief Complaint:  Jose Skeeterroy D Vowell (DOB: 04-Dec-1965) is a 55 y.o. male who presents to the clinic on 10/10/2020 with a chief complaint of Allergic Rhinitis  .     HPI: Jose [name] presents to this clinic in evaluation of allergic disease.  He has a long history of developing these problems with periorbital swelling and runny nose and runny eyes for which he would be treated with a systemic steroid about once every 2 years.  Over the course of the past 4 months his pattern has changed.  He describes periorbital swelling and itchy eyes and watery eyes and runny nose and to some degree some slight nasal congestion and some slight sneezing.  His nose runs all day.  He does rub his eyes and then his eyes really swell up.  He does not really note any obvious trigger giving rise to this issue.  He has not had a significant environmental change and has not started any new medications or supplements.  He does believe that smoke exposure sometimes triggers this issue.  He does not have any anosmia or headaches or ugly nasal discharge.  This occurs even though he has been using either Flonase or Nasonex at least 3 times per week and  continues on an antihistamine.  He is also using some Zaditor which may help slightly.  He has a history of shellfish and fish allergy.  Apparently he has developed facial swelling and eye swelling with consumption of these foods many decades ago.  Apparently he has had lip and throat swelling with these episodes.  He is using lisinopril.  Past Medical History:  Diagnosis Date  . DVT (deep venous thrombosis) (HCC) 03/2015   right leg  . Fibromyalgia   . Gout   . High blood pressure   . High cholesterol   . Migraine   . Peripheral neuropathy 01/26/2017    Past Surgical History:  Procedure Laterality Date  . ABDOMINAL EXPLORATION SURGERY    . BACK SURGERY     x 5, [place], Dr Trey SailorsMark Roy  . CORONARY ANGIOPLASTY WITH STENT PLACEMENT  2011  . LITHOTRIPSY  2017    Allergies as of 10/10/2020      Reactions   Betadine [povidone Iodine] Shortness Of Breath, Swelling, Rash, Other (See Comments)   Burning and blisters, when applied topically.     Shellfish Allergy Hives, Shortness Of Breath, Rash   Iodinated Diagnostic Agents Other (See Comments)   Patient states an MD told him he is "allergic to iodine" after several incidences of reaction to topical betadine/iodine soaps and creams.  No known documentation of IV Contrast.  Patient had Diskogram 09/2001 with contrast into discs without any documented difficulty or prep.  Medication List    allopurinol 300 MG tablet Commonly known as: ZYLOPRIM Take 300 mg by mouth daily.   atorvastatin 80 MG tablet Commonly known as: LIPITOR 40 mg daily.   Eliquis 5 MG Tabs tablet Generic drug: apixaban Take 5 mg by mouth 2 (two) times daily.   famotidine 40 MG tablet Commonly known as: PEPCID Take 40 mg by mouth daily.   fexofenadine 180 MG tablet Commonly known as: ALLEGRA Take 180 mg by mouth daily as needed.   fluticasone 50 MCG/ACT nasal spray Commonly known as: FLONASE Place 2 sprays into both nostrils daily.   gabapentin  300 MG capsule Commonly known as: NEURONTIN 300 mg 3 (three) times daily.   levocetirizine 5 MG tablet Commonly known as: XYZAL 5 mg daily.   lisinopril 5 MG tablet Commonly known as: ZESTRIL Take 5 mg by mouth daily as needed.   metaxalone 800 MG tablet Commonly known as: SKELAXIN Take by mouth.   mometasone 50 MCG/ACT nasal spray Commonly known as: NASONEX Place 2 sprays into the nose daily.   nitroGLYCERIN 0.4 MG SL tablet Commonly known as: NITROSTAT Place 0.4 mg under the tongue every 5 (five) minutes as needed for chest pain.   oxyCODONE 15 MG immediate release tablet Commonly known as: ROXICODONE Take 15 mg by mouth every 6 (six) hours.   pantoprazole 40 MG tablet Commonly known as: PROTONIX Take 40 mg by mouth daily.   triazolam 0.25 MG tablet Commonly known as: HALCION 0.25 mg at bedtime.       Review of systems negative except as noted in HPI / PMHx or noted below:  Review of Systems  Constitutional: Negative.   HENT: Negative.   Eyes: Negative.   Respiratory: Negative.   Cardiovascular: Negative.   Gastrointestinal: Negative.   Genitourinary: Negative.   Musculoskeletal: Negative.   Skin: Negative.   Neurological: Negative.   Endo/Heme/Allergies: Negative.   Psychiatric/Behavioral: Negative.     Family History  Problem Relation Age of Onset  . Diabetes Mother   . Hypertension Mother   . Cancer Father   . Hypertension Father     Social History   Socioeconomic History  . Marital status: Married    Spouse name: Surveyor, minerals  . Number of children: 4  . Years of education: 29  . Highest education level: Not on file  Occupational History    Comment: disabled  Tobacco Use  . Smoking status: Never Smoker  . Smokeless tobacco: Never Used  Substance and Sexual Activity  . Alcohol use: No  . Drug use: No  . Sexual activity: Not on file  Other Topics Concern  . Not on file  Social History Narrative   Lives with wife   Very little cafeine    Environmental and Social history  Lives in a house with a dry environment, dog located inside the household, carpet in the bedroom, no plastic on the bed, no plastic on the pillow, and no smoking ongoing with inside the household.  He is disabled from a musculoskeletal issue.  Objective:   Vitals:   10/10/20 0931 10/10/20 1105  BP: (!) 122/102 (!) 132/102  Pulse: 100   Resp: 20   SpO2: 95%    Height: 6' 0.9" (185.2 cm) Weight: 224 lb (101.6 kg)  Physical Exam Constitutional:      Appearance: He is not diaphoretic.  HENT:     Head: Normocephalic.     Right Ear: Tympanic membrane, ear canal and external ear normal.  Left Ear: Tympanic membrane, ear canal and external ear normal.     Nose: Septal deviation (Right to left) and mucosal edema present. No rhinorrhea.     Mouth/Throat:     Mouth: Oropharynx is clear and moist and mucous membranes are normal.     Pharynx: Uvula midline. No oropharyngeal exudate.  Eyes:     Conjunctiva/sclera: Conjunctivae normal.  Neck:     Thyroid: No thyromegaly.     Trachea: Trachea normal. No tracheal tenderness or tracheal deviation.  Cardiovascular:     Rate and Rhythm: Normal rate and regular rhythm.     Heart sounds: Normal heart sounds, S1 normal and S2 normal. No murmur heard.   Pulmonary:     Effort: No respiratory distress.     Breath sounds: Normal breath sounds. No stridor. No wheezing or rales.  Musculoskeletal:        General: No edema.  Lymphadenopathy:     Head:     Right side of head: No tonsillar adenopathy.     Left side of head: No tonsillar adenopathy.     Cervical: No cervical adenopathy.  Skin:    Findings: No erythema or rash.     Nails: There is no clubbing.  Neurological:     Mental Status: He is alert.     Diagnostics: Allergy skin tests were performed.  He demonstrated hypersensitivity to house dust mite, dog, and ragweed.  He did not demonstrate any hypersensitivity against shellfish or fish  mix.   Assessment and Plan:    1. Perennial allergic rhinitis   2. Perennial allergic conjunctivitis of both eyes   3. Dry eye syndrome of both eyes   4. Periorbital swelling   5. Allergy with anaphylaxis due to food   6. On angiotensin-converting enzyme (ACE) inhibitors     1.  Allergen avoidance measures -house dust mite, dog, ragweed  2.  Treat and prevent inflammation:   A. Flonase sensmyst - 2 sprays ecah nostril I time per day  B. Montelukast 10 mg - 1 tablet 1 time per day  3.  If needed:   A.  Systane eyedrops -1 drop each eye multiple times per day  B.  Pataday -1 drop each eye 1 time per day  C.  Nasal ipratropium 0.06% -2 sprays each nostril every 6 hours to dry nose  D.  EpiPen, Benadryl, MD/ER evaluation for allergic reaction  4.  Replace lisinopril with losartan 50 mg - 1 tablet 1 time per day  5.  Do not use any oral antihistamines at this point  6.  Obtain blood - CBC w/d, TSH, FT4, Thyroid peroxidase, C4, shellfish panel, fish panel  7.  Obtain exam with eye doctor to examine for dry eye syndrome  8. Return to clinic in 4 weeks or earlier if problem  I think that Enis's issue with his nasal airway and eyes is a combination of atopic disease and possible dry eye syndrome.  I am going to have him utilize anti-inflammatory agents for his airway in conjunction with house dust mite and dog avoidance measures and eliminate his oral antihistamine use.  He has a history of developing angioedema and allergic reaction and thus we need to eliminate his ACE inhibitor and he will now use losartan to control his blood pressure and follow-up with his primary care doctor regarding control of this issue.  I will have him undergo further evaluation for his apparent history of food hypersensitivity and also looking for other signs  of immune activation with the blood test noted above.  I will see him back in his clinic in 4 weeks or earlier if there is a problem.  Jessica Priest,  MD Allergy / Immunology [facility] [place]

## 2020-10-14 ENCOUNTER — Encounter: Payer: Self-pay | Admitting: Allergy and Immunology

## 2020-10-25 DIAGNOSIS — T7800XA Anaphylactic reaction due to unspecified food, initial encounter: Secondary | ICD-10-CM | POA: Diagnosis not present

## 2020-10-25 DIAGNOSIS — E069 Thyroiditis, unspecified: Secondary | ICD-10-CM | POA: Diagnosis not present

## 2020-10-25 DIAGNOSIS — H5789 Other specified disorders of eye and adnexa: Secondary | ICD-10-CM | POA: Diagnosis not present

## 2020-10-25 DIAGNOSIS — J3089 Other allergic rhinitis: Secondary | ICD-10-CM | POA: Diagnosis not present

## 2020-10-26 LAB — ALLERGEN PROFILE, SHELLFISH

## 2020-10-26 LAB — ALLERGEN PROFILE, FOOD-FISH

## 2020-10-26 LAB — CBC WITH DIFFERENTIAL/PLATELET
Eos: 1 %
Hemoglobin: 14.7 g/dL (ref 13.0–17.7)

## 2020-10-26 LAB — THYROID PEROXIDASE ANTIBODY: Thyroperoxidase Ab SerPl-aCnc: <8 IU/mL (ref 0–34)

## 2020-10-30 LAB — CBC WITH DIFFERENTIAL/PLATELET
Basophils Absolute: 0 10*3/uL (ref 0.0–0.2)
Basos: 1 %
EOS (ABSOLUTE): 0 10*3/uL (ref 0.0–0.4)
Hematocrit: 44.4 % (ref 37.5–51.0)
Immature Grans (Abs): 0 10*3/uL (ref 0.0–0.1)
Immature Granulocytes: 1 %
Lymphocytes Absolute: 1.5 10*3/uL (ref 0.7–3.1)
Lymphs: 25 %
MCH: 27.6 pg (ref 26.6–33.0)
MCHC: 33.1 g/dL (ref 31.5–35.7)
MCV: 83 fL (ref 79–97)
Monocytes Absolute: 0.4 10*3/uL (ref 0.1–0.9)
Monocytes: 7 %
Neutrophils Absolute: 3.8 10*3/uL (ref 1.4–7.0)
Neutrophils: 65 %
Platelets: 228 10*3/uL (ref 150–450)
RBC: 5.33 x10E6/uL (ref 4.14–5.80)
RDW: 13.7 % (ref 11.6–15.4)
WBC: 5.8 10*3/uL (ref 3.4–10.8)

## 2020-10-30 LAB — ALLERGEN PROFILE, SHELLFISH
Clam IgE: <0.1 kU/L
F080-IgE Lobster: <0.1 kU/L
Scallop IgE: <0.1 kU/L
Shrimp IgE: <0.1 kU/L

## 2020-10-30 LAB — C4 COMPLEMENT: Complement C4, Serum: 27 mg/dL (ref 12–38)

## 2020-10-30 LAB — ALLERGEN PROFILE, FOOD-FISH
Allergen Trout IgE: <0.1 kU/L
Allergen Walley Pike IgE: <0.1 kU/L
Codfish IgE: <0.1 kU/L
Halibut IgE: <0.1 kU/L

## 2020-10-30 LAB — TSH+FREE T4
Free T4: 1.07 ng/dL (ref 0.82–1.77)
TSH: 1.02 u[IU]/mL (ref 0.450–4.500)

## 2020-11-05 DIAGNOSIS — M545 Low back pain, unspecified: Secondary | ICD-10-CM | POA: Diagnosis not present

## 2020-11-05 DIAGNOSIS — G43909 Migraine, unspecified, not intractable, without status migrainosus: Secondary | ICD-10-CM | POA: Diagnosis not present

## 2020-11-05 DIAGNOSIS — G8929 Other chronic pain: Secondary | ICD-10-CM | POA: Diagnosis not present

## 2020-11-05 DIAGNOSIS — F1120 Opioid dependence, uncomplicated: Secondary | ICD-10-CM | POA: Diagnosis not present

## 2020-11-05 DIAGNOSIS — G894 Chronic pain syndrome: Secondary | ICD-10-CM | POA: Diagnosis not present

## 2020-11-05 DIAGNOSIS — M79604 Pain in right leg: Secondary | ICD-10-CM | POA: Diagnosis not present

## 2020-11-05 DIAGNOSIS — M25561 Pain in right knee: Secondary | ICD-10-CM | POA: Diagnosis not present

## 2020-11-05 DIAGNOSIS — Z79891 Long term (current) use of opiate analgesic: Secondary | ICD-10-CM | POA: Diagnosis not present

## 2020-11-05 DIAGNOSIS — T7849XA Other allergy, initial encounter: Secondary | ICD-10-CM | POA: Diagnosis not present

## 2020-11-05 DIAGNOSIS — M961 Postlaminectomy syndrome, not elsewhere classified: Secondary | ICD-10-CM | POA: Diagnosis not present

## 2020-11-07 ENCOUNTER — Ambulatory Visit: Payer: Medicare HMO | Admitting: Allergy and Immunology

## 2020-11-08 DIAGNOSIS — G894 Chronic pain syndrome: Secondary | ICD-10-CM | POA: Diagnosis not present

## 2020-11-14 ENCOUNTER — Ambulatory Visit: Payer: Medicare HMO | Admitting: Allergy and Immunology

## 2020-11-14 ENCOUNTER — Other Ambulatory Visit: Payer: Self-pay

## 2020-11-14 ENCOUNTER — Encounter: Payer: Self-pay | Admitting: Allergy and Immunology

## 2020-11-14 VITALS — BP 128/90 | HR 88 | Resp 20

## 2020-11-14 DIAGNOSIS — H1013 Acute atopic conjunctivitis, bilateral: Secondary | ICD-10-CM | POA: Diagnosis not present

## 2020-11-14 DIAGNOSIS — Z91018 Allergy to other foods: Secondary | ICD-10-CM | POA: Diagnosis not present

## 2020-11-14 DIAGNOSIS — H04123 Dry eye syndrome of bilateral lacrimal glands: Secondary | ICD-10-CM | POA: Diagnosis not present

## 2020-11-14 DIAGNOSIS — H5789 Other specified disorders of eye and adnexa: Secondary | ICD-10-CM | POA: Diagnosis not present

## 2020-11-14 DIAGNOSIS — J3089 Other allergic rhinitis: Secondary | ICD-10-CM | POA: Diagnosis not present

## 2020-11-14 MED ORDER — ALREX 0.2 % OP SUSP: OPHTHALMIC | 5 refills | Status: AC

## 2020-11-14 NOTE — Progress Notes (Signed)
[place] - [place] - [place]   Follow-up Note  Referring Provider: Street, Stephanie Coup, * Primary Provider: Street, Stephanie Coup, MD Date of Office Visit: 11/14/2020  Subjective:   Jose [name] (DOB: [birth year]) is a 55 y.o. male who [facility] on 11/14/2020 in re-evaluation of the following:  HPI: Jose [name] returns to this clinic in evaluation of allergic rhinoconjunctivitis and suspected dry eye syndrome and a history of periorbital swelling and history of allergy to shellfish and fish.  I last saw him in this clinic during his initial evaluation 10 October 2020 at which point in time we attempted to address each issue.  He is significantly improved regarding both his eyes and nose.  In fact, he has no issues with his nose.  He still has some constant runny eyes but he has not had any swelling of his eyes.  He has been very good about performing allergen avoidance measures especially against dust mite and dog.  He has been consistently using his Flonase and montelukast.  He does not believe that Pataday is helped his eyes at all.  We eliminated his lisinopril during his last visit and is using losartan and apparently his blood pressure is under good control.  We informed him that his shellfish and fish IgE panel were negative and in conjunction with his skin test showing no sensitivity to shellfish or fish it is probably okay to eat these foods.  He is not very interested in eating these foods at this point.  He has not made an appointment with an eye doctor yet.  Allergies as of 11/14/2020      Reactions   Betadine [povidone Iodine] Shortness Of Breath, Swelling, Rash, Other (See Comments)   Burning and blisters, when applied topically.     Shellfish Allergy Hives, Shortness Of Breath, Rash   Iodinated Diagnostic Agents Other (See Comments)   Patient states an MD told him he is "allergic to iodine" after several incidences  of reaction to topical betadine/iodine soaps and creams.  No known documentation of IV Contrast.  Patient had Diskogram 09/2001 with contrast into discs without any documented difficulty or prep.      Medication List    allopurinol 300 MG tablet Commonly known as: ZYLOPRIM Take 300 mg by mouth daily.   atorvastatin 80 MG tablet Commonly known as: LIPITOR 40 mg daily.   Eliquis 5 MG Tabs tablet Generic drug: apixaban Take 5 mg by mouth 2 (two) times daily.   EPINEPHrine 0.3 mg/0.3 mL Soaj injection Commonly known as: EPI-PEN Use as directed for life-threatening allergic reaction.   famotidine 40 MG tablet Commonly known as: PEPCID Take 40 mg by mouth daily.   gabapentin 300 MG capsule Commonly known as: NEURONTIN 300 mg 3 (three) times daily.   ipratropium 0.06 % nasal spray Commonly known as: ATROVENT Can use two sprays in each nostril every 6 hours if needed to dry up nose.   losartan 50 MG tablet Commonly known as: COZAAR Take 1 tablet (50 mg total) by mouth daily.   metaxalone 800 MG tablet Commonly known as: SKELAXIN Take by mouth.   montelukast 10 MG tablet Commonly known as: SINGULAIR Take 1 tablet (10 mg total) by mouth at bedtime.   nitroGLYCERIN 0.4 MG SL tablet Commonly known as: NITROSTAT Place 0.4 mg under the tongue every 5 (five) minutes as needed for chest pain.   Olopatadine HCl 0.2 % Soln Can use one  drop in each eye once daily if needed for red, itchy, watery eyes.   oxyCODONE 15 MG immediate release tablet Commonly known as: ROXICODONE Take 15 mg by mouth every 6 (six) hours.   pantoprazole 40 MG tablet Commonly known as: PROTONIX Take 40 mg by mouth daily.   triazolam 0.25 MG tablet Commonly known as: HALCION 0.25 mg at bedtime.       Past Medical History:  Diagnosis Date  . Allergic rhinitis   . DVT (deep venous thrombosis) (HCC) 03/2015   right leg  . Fibromyalgia   . Gout   . High blood pressure   . High cholesterol   .  Migraine   . Peripheral neuropathy 01/26/2017    Past Surgical History:  Procedure Laterality Date  . ABDOMINAL EXPLORATION SURGERY    . BACK SURGERY     x 5, [place], Dr Trey Sailors  . CORONARY ANGIOPLASTY WITH STENT PLACEMENT  2011  . LITHOTRIPSY  2017    Review of systems negative except as noted in HPI / PMHx or noted below:  Review of Systems  Constitutional: Negative.   HENT: Negative.   Eyes: Negative.   Respiratory: Negative.   Cardiovascular: Negative.   Gastrointestinal: Negative.   Genitourinary: Negative.   Musculoskeletal: Negative.   Skin: Negative.   Neurological: Negative.   Endo/Heme/Allergies: Negative.   Psychiatric/Behavioral: Negative.      Objective:   Vitals:   11/14/20 1056  BP: 128/90  Pulse: 88  Resp: 20  SpO2: 95%          Physical Exam Constitutional:      Appearance: He is not diaphoretic.  HENT:     Head: Normocephalic.     Right Ear: Tympanic membrane, ear canal and external ear normal.     Left Ear: Tympanic membrane, ear canal and external ear normal.     Nose: Nose normal. No mucosal edema or rhinorrhea.     Mouth/Throat:     Pharynx: Uvula midline. No oropharyngeal exudate.  Eyes:     Conjunctiva/sclera: Conjunctivae normal.  Neck:     Thyroid: No thyromegaly.     Trachea: Trachea normal. No tracheal tenderness or tracheal deviation.  Cardiovascular:     Rate and Rhythm: Normal rate and regular rhythm.     Heart sounds: Normal heart sounds, S1 normal and S2 normal. No murmur heard.   Pulmonary:     Effort: No respiratory distress.     Breath sounds: Normal breath sounds. No stridor. No wheezing or rales.  Lymphadenopathy:     Head:     Right side of head: No tonsillar adenopathy.     Left side of head: No tonsillar adenopathy.     Cervical: No cervical adenopathy.  Skin:    Findings: No erythema or rash.     Nails: There is no clubbing.  Neurological:     Mental Status: He is alert.     Diagnostics:    Results of blood tests obtained 25 October 2020 identified WBC 5.8, absolute eosinophil 0, absolute lymphocyte 1500, hemoglobin 14.7, platelet 228, TSH 1.020 IU/mL, free T4 1.07 NG/DL, thyroid peroxidase antibody less than 8U/mL, no IgE antibodies identified on a fish or shellfish panel, C4 27 mg/DL  Assessment and Plan:   1. Perennial allergic rhinitis   2. Perennial allergic conjunctivitis of both eyes   3. Dry eye syndrome of both eyes   4. Periorbital swelling   5. Food allergy     1.  Allergen avoidance measures -house dust mite, dog, ragweed  2.  Treat and prevent inflammation:   A. Flonase sensimist - 2 sprays each nostril I time per day  B. Montelukast 10 mg - 1 tablet 1 time per day  C. Alrex - 1 drop each eye 1 time per day. Takes days to work  3.  If needed:   A.  Systane eyedrops -1 drop each eye multiple times per day  B.  Nasal ipratropium 0.06% -2 sprays each nostril every 6 hours to dry nose  C.  EpiPen, Benadryl, MD/ER evaluation for allergic reaction  4. Do not use any oral antihistamines at this point  5. [facility] for exam / dry eye syndrome  6. Immunotherapy???  7. Return to clinic in Summer 2022 or earlier if problem  I am going to start [place] on a very low potency topical steroid using Alrex eyedrops and hopefully after a few days this will result in some decrease inflammation of his eye.  I think he does have dry eye syndrome complicating his allergic rhinoconjunctivitis and I would like for him to be evaluated by an eye doctor and I gave him the name of a local eye doctor who does a very good job.  He would definitely be a candidate for immunotherapy if he fails medical treatment.  We will see him back in his clinic in the summer 2022 or earlier if there is a problem.  Laurette Schimke, MD Allergy / Immunology [facility]

## 2020-11-14 NOTE — Patient Instructions (Addendum)
  1.  Allergen avoidance measures -house dust mite, dog, ragweed  2.  Treat and prevent inflammation:   A. Flonase sensimist - 2 sprays each nostril I time per day  B. Montelukast 10 mg - 1 tablet 1 time per day  C. Alrex - 1 drop each eye 1 time per day. Takes days to work  3.  If needed:   A.  Systane eyedrops -1 drop each eye multiple times per day  B. Nasal ipratropium 0.06% -2 sprays each nostril every 6 hours to dry nose  C.  EpiPen, Benadryl, MD/ER evaluation for allergic reaction  4. Do not use any oral antihistamines at this point  5. [facility] for exam / dry eye syndrome  6. Immunotherapy???  7. Return to clinic in Summer 2022 or earlier if problem

## 2020-11-15 ENCOUNTER — Encounter: Payer: Self-pay | Admitting: Allergy and Immunology

## 2020-12-03 DIAGNOSIS — M961 Postlaminectomy syndrome, not elsewhere classified: Secondary | ICD-10-CM | POA: Diagnosis not present

## 2020-12-03 DIAGNOSIS — G8929 Other chronic pain: Secondary | ICD-10-CM | POA: Diagnosis not present

## 2020-12-03 DIAGNOSIS — M545 Low back pain, unspecified: Secondary | ICD-10-CM | POA: Diagnosis not present

## 2020-12-03 DIAGNOSIS — T7849XA Other allergy, initial encounter: Secondary | ICD-10-CM | POA: Diagnosis not present

## 2020-12-03 DIAGNOSIS — F1120 Opioid dependence, uncomplicated: Secondary | ICD-10-CM | POA: Diagnosis not present

## 2020-12-03 DIAGNOSIS — M25561 Pain in right knee: Secondary | ICD-10-CM | POA: Diagnosis not present

## 2020-12-03 DIAGNOSIS — G894 Chronic pain syndrome: Secondary | ICD-10-CM | POA: Diagnosis not present

## 2020-12-03 DIAGNOSIS — G43909 Migraine, unspecified, not intractable, without status migrainosus: Secondary | ICD-10-CM | POA: Diagnosis not present

## 2020-12-03 DIAGNOSIS — M79604 Pain in right leg: Secondary | ICD-10-CM | POA: Diagnosis not present

## 2020-12-03 DIAGNOSIS — Z79891 Long term (current) use of opiate analgesic: Secondary | ICD-10-CM | POA: Diagnosis not present

## 2020-12-31 DIAGNOSIS — F1120 Opioid dependence, uncomplicated: Secondary | ICD-10-CM | POA: Diagnosis not present

## 2020-12-31 DIAGNOSIS — M79604 Pain in right leg: Secondary | ICD-10-CM | POA: Diagnosis not present

## 2020-12-31 DIAGNOSIS — M961 Postlaminectomy syndrome, not elsewhere classified: Secondary | ICD-10-CM | POA: Diagnosis not present

## 2020-12-31 DIAGNOSIS — M545 Low back pain, unspecified: Secondary | ICD-10-CM | POA: Diagnosis not present

## 2020-12-31 DIAGNOSIS — G8929 Other chronic pain: Secondary | ICD-10-CM | POA: Diagnosis not present

## 2020-12-31 DIAGNOSIS — T7849XA Other allergy, initial encounter: Secondary | ICD-10-CM | POA: Diagnosis not present

## 2020-12-31 DIAGNOSIS — M25561 Pain in right knee: Secondary | ICD-10-CM | POA: Diagnosis not present

## 2020-12-31 DIAGNOSIS — G43909 Migraine, unspecified, not intractable, without status migrainosus: Secondary | ICD-10-CM | POA: Diagnosis not present

## 2020-12-31 DIAGNOSIS — G894 Chronic pain syndrome: Secondary | ICD-10-CM | POA: Diagnosis not present

## 2020-12-31 DIAGNOSIS — G90523 Complex regional pain syndrome I of lower limb, bilateral: Secondary | ICD-10-CM | POA: Diagnosis not present

## 2020-12-31 DIAGNOSIS — Z79891 Long term (current) use of opiate analgesic: Secondary | ICD-10-CM | POA: Diagnosis not present

## 2021-01-31 DIAGNOSIS — G43909 Migraine, unspecified, not intractable, without status migrainosus: Secondary | ICD-10-CM | POA: Diagnosis not present

## 2021-01-31 DIAGNOSIS — M961 Postlaminectomy syndrome, not elsewhere classified: Secondary | ICD-10-CM | POA: Diagnosis not present

## 2021-01-31 DIAGNOSIS — G894 Chronic pain syndrome: Secondary | ICD-10-CM | POA: Diagnosis not present

## 2021-01-31 DIAGNOSIS — M25561 Pain in right knee: Secondary | ICD-10-CM | POA: Diagnosis not present

## 2021-01-31 DIAGNOSIS — F1120 Opioid dependence, uncomplicated: Secondary | ICD-10-CM | POA: Diagnosis not present

## 2021-01-31 DIAGNOSIS — M545 Low back pain, unspecified: Secondary | ICD-10-CM | POA: Diagnosis not present

## 2021-01-31 DIAGNOSIS — Z79891 Long term (current) use of opiate analgesic: Secondary | ICD-10-CM | POA: Diagnosis not present

## 2021-01-31 DIAGNOSIS — T7849XA Other allergy, initial encounter: Secondary | ICD-10-CM | POA: Diagnosis not present

## 2021-01-31 DIAGNOSIS — G90523 Complex regional pain syndrome I of lower limb, bilateral: Secondary | ICD-10-CM | POA: Diagnosis not present

## 2021-01-31 DIAGNOSIS — M79604 Pain in right leg: Secondary | ICD-10-CM | POA: Diagnosis not present

## 2021-01-31 DIAGNOSIS — G8929 Other chronic pain: Secondary | ICD-10-CM | POA: Diagnosis not present

## 2021-02-24 DIAGNOSIS — D6851 Activated protein C resistance: Secondary | ICD-10-CM | POA: Diagnosis not present

## 2021-02-24 DIAGNOSIS — I9589 Other hypotension: Secondary | ICD-10-CM | POA: Diagnosis not present

## 2021-02-24 DIAGNOSIS — E782 Mixed hyperlipidemia: Secondary | ICD-10-CM | POA: Diagnosis not present

## 2021-02-24 DIAGNOSIS — R69 Illness, unspecified: Secondary | ICD-10-CM | POA: Diagnosis not present

## 2021-02-24 DIAGNOSIS — M961 Postlaminectomy syndrome, not elsewhere classified: Secondary | ICD-10-CM | POA: Diagnosis not present

## 2021-02-24 DIAGNOSIS — M1A09X0 Idiopathic chronic gout, multiple sites, without tophus (tophi): Secondary | ICD-10-CM | POA: Diagnosis not present

## 2021-02-24 DIAGNOSIS — G4700 Insomnia, unspecified: Secondary | ICD-10-CM | POA: Diagnosis not present

## 2021-02-24 DIAGNOSIS — R0989 Other specified symptoms and signs involving the circulatory and respiratory systems: Secondary | ICD-10-CM | POA: Diagnosis not present

## 2021-02-24 DIAGNOSIS — M5137 Other intervertebral disc degeneration, lumbosacral region: Secondary | ICD-10-CM | POA: Diagnosis not present

## 2021-02-24 DIAGNOSIS — J301 Allergic rhinitis due to pollen: Secondary | ICD-10-CM | POA: Diagnosis not present

## 2021-02-25 DIAGNOSIS — G43909 Migraine, unspecified, not intractable, without status migrainosus: Secondary | ICD-10-CM | POA: Diagnosis not present

## 2021-02-25 DIAGNOSIS — T7849XA Other allergy, initial encounter: Secondary | ICD-10-CM | POA: Diagnosis not present

## 2021-02-25 DIAGNOSIS — M545 Low back pain, unspecified: Secondary | ICD-10-CM | POA: Diagnosis not present

## 2021-02-25 DIAGNOSIS — M961 Postlaminectomy syndrome, not elsewhere classified: Secondary | ICD-10-CM | POA: Diagnosis not present

## 2021-02-25 DIAGNOSIS — G90523 Complex regional pain syndrome I of lower limb, bilateral: Secondary | ICD-10-CM | POA: Diagnosis not present

## 2021-02-25 DIAGNOSIS — G894 Chronic pain syndrome: Secondary | ICD-10-CM | POA: Diagnosis not present

## 2021-02-25 DIAGNOSIS — G8929 Other chronic pain: Secondary | ICD-10-CM | POA: Diagnosis not present

## 2021-02-25 DIAGNOSIS — F1120 Opioid dependence, uncomplicated: Secondary | ICD-10-CM | POA: Diagnosis not present

## 2021-02-25 DIAGNOSIS — M79604 Pain in right leg: Secondary | ICD-10-CM | POA: Diagnosis not present

## 2021-02-25 DIAGNOSIS — M25561 Pain in right knee: Secondary | ICD-10-CM | POA: Diagnosis not present

## 2021-02-25 DIAGNOSIS — Z79891 Long term (current) use of opiate analgesic: Secondary | ICD-10-CM | POA: Diagnosis not present

## 2021-03-28 ENCOUNTER — Other Ambulatory Visit: Payer: Self-pay | Admitting: Allergy and Immunology

## 2021-03-28 DIAGNOSIS — G894 Chronic pain syndrome: Secondary | ICD-10-CM | POA: Diagnosis not present

## 2021-03-28 DIAGNOSIS — M25561 Pain in right knee: Secondary | ICD-10-CM | POA: Diagnosis not present

## 2021-03-28 DIAGNOSIS — Z79891 Long term (current) use of opiate analgesic: Secondary | ICD-10-CM | POA: Diagnosis not present

## 2021-03-28 DIAGNOSIS — G43909 Migraine, unspecified, not intractable, without status migrainosus: Secondary | ICD-10-CM | POA: Diagnosis not present

## 2021-03-28 DIAGNOSIS — M545 Low back pain, unspecified: Secondary | ICD-10-CM | POA: Diagnosis not present

## 2021-03-28 DIAGNOSIS — T7849XA Other allergy, initial encounter: Secondary | ICD-10-CM | POA: Diagnosis not present

## 2021-03-28 DIAGNOSIS — M79604 Pain in right leg: Secondary | ICD-10-CM | POA: Diagnosis not present

## 2021-03-28 DIAGNOSIS — G8929 Other chronic pain: Secondary | ICD-10-CM | POA: Diagnosis not present

## 2021-03-28 DIAGNOSIS — G90523 Complex regional pain syndrome I of lower limb, bilateral: Secondary | ICD-10-CM | POA: Diagnosis not present

## 2021-03-28 DIAGNOSIS — M961 Postlaminectomy syndrome, not elsewhere classified: Secondary | ICD-10-CM | POA: Diagnosis not present

## 2021-04-03 DIAGNOSIS — Z6827 Body mass index (BMI) 27.0-27.9, adult: Secondary | ICD-10-CM | POA: Diagnosis not present

## 2021-04-03 DIAGNOSIS — L03116 Cellulitis of left lower limb: Secondary | ICD-10-CM | POA: Diagnosis not present

## 2021-04-03 DIAGNOSIS — L739 Follicular disorder, unspecified: Secondary | ICD-10-CM | POA: Diagnosis not present

## 2021-04-03 DIAGNOSIS — Z86718 Personal history of other venous thrombosis and embolism: Secondary | ICD-10-CM | POA: Diagnosis not present

## 2021-04-24 DIAGNOSIS — E663 Overweight: Secondary | ICD-10-CM | POA: Diagnosis not present

## 2021-04-24 DIAGNOSIS — I82531 Chronic embolism and thrombosis of right popliteal vein: Secondary | ICD-10-CM | POA: Diagnosis not present

## 2021-04-24 DIAGNOSIS — R079 Chest pain, unspecified: Secondary | ICD-10-CM | POA: Diagnosis not present

## 2021-04-24 DIAGNOSIS — M7989 Other specified soft tissue disorders: Secondary | ICD-10-CM | POA: Diagnosis not present

## 2021-04-24 DIAGNOSIS — I82511 Chronic embolism and thrombosis of right femoral vein: Secondary | ICD-10-CM | POA: Diagnosis not present

## 2021-04-24 DIAGNOSIS — M79604 Pain in right leg: Secondary | ICD-10-CM | POA: Diagnosis not present

## 2021-04-24 DIAGNOSIS — R252 Cramp and spasm: Secondary | ICD-10-CM | POA: Diagnosis not present

## 2021-04-24 DIAGNOSIS — I82401 Acute embolism and thrombosis of unspecified deep veins of right lower extremity: Secondary | ICD-10-CM | POA: Diagnosis not present

## 2021-04-29 DIAGNOSIS — M25561 Pain in right knee: Secondary | ICD-10-CM | POA: Diagnosis not present

## 2021-04-29 DIAGNOSIS — G894 Chronic pain syndrome: Secondary | ICD-10-CM | POA: Diagnosis not present

## 2021-04-29 DIAGNOSIS — G8929 Other chronic pain: Secondary | ICD-10-CM | POA: Diagnosis not present

## 2021-04-29 DIAGNOSIS — M961 Postlaminectomy syndrome, not elsewhere classified: Secondary | ICD-10-CM | POA: Diagnosis not present

## 2021-04-29 DIAGNOSIS — Z79891 Long term (current) use of opiate analgesic: Secondary | ICD-10-CM | POA: Diagnosis not present

## 2021-04-29 DIAGNOSIS — M79604 Pain in right leg: Secondary | ICD-10-CM | POA: Diagnosis not present

## 2021-04-29 DIAGNOSIS — M545 Low back pain, unspecified: Secondary | ICD-10-CM | POA: Diagnosis not present

## 2021-05-29 DIAGNOSIS — M25561 Pain in right knee: Secondary | ICD-10-CM | POA: Diagnosis not present

## 2021-05-29 DIAGNOSIS — M79604 Pain in right leg: Secondary | ICD-10-CM | POA: Diagnosis not present

## 2021-05-29 DIAGNOSIS — G43901 Migraine, unspecified, not intractable, with status migrainosus: Secondary | ICD-10-CM | POA: Diagnosis not present

## 2021-05-29 DIAGNOSIS — G894 Chronic pain syndrome: Secondary | ICD-10-CM | POA: Diagnosis not present

## 2021-05-29 DIAGNOSIS — Z79891 Long term (current) use of opiate analgesic: Secondary | ICD-10-CM | POA: Diagnosis not present

## 2021-05-29 DIAGNOSIS — G90523 Complex regional pain syndrome I of lower limb, bilateral: Secondary | ICD-10-CM | POA: Diagnosis not present

## 2021-05-29 DIAGNOSIS — M545 Low back pain, unspecified: Secondary | ICD-10-CM | POA: Diagnosis not present

## 2021-05-29 DIAGNOSIS — G8929 Other chronic pain: Secondary | ICD-10-CM | POA: Diagnosis not present

## 2021-05-29 DIAGNOSIS — M961 Postlaminectomy syndrome, not elsewhere classified: Secondary | ICD-10-CM | POA: Diagnosis not present

## 2021-05-29 DIAGNOSIS — T7849XA Other allergy, initial encounter: Secondary | ICD-10-CM | POA: Diagnosis not present

## 2021-06-10 DIAGNOSIS — R0989 Other specified symptoms and signs involving the circulatory and respiratory systems: Secondary | ICD-10-CM | POA: Diagnosis not present

## 2021-06-10 DIAGNOSIS — D6869 Other thrombophilia: Secondary | ICD-10-CM | POA: Diagnosis not present

## 2021-06-10 DIAGNOSIS — I25119 Atherosclerotic heart disease of native coronary artery with unspecified angina pectoris: Secondary | ICD-10-CM | POA: Diagnosis not present

## 2021-06-10 DIAGNOSIS — D510 Vitamin B12 deficiency anemia due to intrinsic factor deficiency: Secondary | ICD-10-CM | POA: Diagnosis not present

## 2021-06-10 DIAGNOSIS — D6851 Activated protein C resistance: Secondary | ICD-10-CM | POA: Diagnosis not present

## 2021-06-10 DIAGNOSIS — M5137 Other intervertebral disc degeneration, lumbosacral region: Secondary | ICD-10-CM | POA: Diagnosis not present

## 2021-06-10 DIAGNOSIS — Z125 Encounter for screening for malignant neoplasm of prostate: Secondary | ICD-10-CM | POA: Diagnosis not present

## 2021-06-10 DIAGNOSIS — Z0000 Encounter for general adult medical examination without abnormal findings: Secondary | ICD-10-CM | POA: Diagnosis not present

## 2021-06-10 DIAGNOSIS — E782 Mixed hyperlipidemia: Secondary | ICD-10-CM | POA: Diagnosis not present

## 2021-06-10 DIAGNOSIS — M961 Postlaminectomy syndrome, not elsewhere classified: Secondary | ICD-10-CM | POA: Diagnosis not present

## 2021-07-01 DIAGNOSIS — M545 Low back pain, unspecified: Secondary | ICD-10-CM | POA: Diagnosis not present

## 2021-07-01 DIAGNOSIS — T7849XA Other allergy, initial encounter: Secondary | ICD-10-CM | POA: Diagnosis not present

## 2021-07-01 DIAGNOSIS — Z79891 Long term (current) use of opiate analgesic: Secondary | ICD-10-CM | POA: Diagnosis not present

## 2021-07-01 DIAGNOSIS — G43909 Migraine, unspecified, not intractable, without status migrainosus: Secondary | ICD-10-CM | POA: Diagnosis not present

## 2021-07-01 DIAGNOSIS — G894 Chronic pain syndrome: Secondary | ICD-10-CM | POA: Diagnosis not present

## 2021-07-01 DIAGNOSIS — M25561 Pain in right knee: Secondary | ICD-10-CM | POA: Diagnosis not present

## 2021-07-01 DIAGNOSIS — M79601 Pain in right arm: Secondary | ICD-10-CM | POA: Diagnosis not present

## 2021-07-01 DIAGNOSIS — G90523 Complex regional pain syndrome I of lower limb, bilateral: Secondary | ICD-10-CM | POA: Diagnosis not present

## 2021-07-01 DIAGNOSIS — M961 Postlaminectomy syndrome, not elsewhere classified: Secondary | ICD-10-CM | POA: Diagnosis not present

## 2021-07-03 ENCOUNTER — Ambulatory Visit (INDEPENDENT_AMBULATORY_CARE_PROVIDER_SITE_OTHER): Payer: Medicare HMO | Admitting: Podiatry

## 2021-07-03 DIAGNOSIS — Z91199 Patient's noncompliance with other medical treatment and regimen due to unspecified reason: Secondary | ICD-10-CM

## 2021-07-13 DIAGNOSIS — J9811 Atelectasis: Secondary | ICD-10-CM | POA: Diagnosis not present

## 2021-07-13 DIAGNOSIS — R0781 Pleurodynia: Secondary | ICD-10-CM | POA: Diagnosis not present

## 2021-07-13 DIAGNOSIS — S40012A Contusion of left shoulder, initial encounter: Secondary | ICD-10-CM | POA: Diagnosis not present

## 2021-07-13 DIAGNOSIS — N281 Cyst of kidney, acquired: Secondary | ICD-10-CM | POA: Diagnosis not present

## 2021-07-13 DIAGNOSIS — K8689 Other specified diseases of pancreas: Secondary | ICD-10-CM | POA: Diagnosis not present

## 2021-07-13 DIAGNOSIS — S300XXA Contusion of lower back and pelvis, initial encounter: Secondary | ICD-10-CM | POA: Diagnosis not present

## 2021-07-13 DIAGNOSIS — R000 Tachycardia, unspecified: Secondary | ICD-10-CM | POA: Diagnosis not present

## 2021-07-13 DIAGNOSIS — R0602 Shortness of breath: Secondary | ICD-10-CM | POA: Diagnosis not present

## 2021-07-13 DIAGNOSIS — I10 Essential (primary) hypertension: Secondary | ICD-10-CM | POA: Diagnosis not present

## 2021-07-13 DIAGNOSIS — R102 Pelvic and perineal pain: Secondary | ICD-10-CM | POA: Diagnosis not present

## 2021-07-13 DIAGNOSIS — W010XXA Fall on same level from slipping, tripping and stumbling without subsequent striking against object, initial encounter: Secondary | ICD-10-CM | POA: Diagnosis not present

## 2021-07-13 DIAGNOSIS — M47816 Spondylosis without myelopathy or radiculopathy, lumbar region: Secondary | ICD-10-CM | POA: Diagnosis not present

## 2021-07-13 DIAGNOSIS — E871 Hypo-osmolality and hyponatremia: Secondary | ICD-10-CM | POA: Diagnosis not present

## 2021-07-13 DIAGNOSIS — E785 Hyperlipidemia, unspecified: Secondary | ICD-10-CM | POA: Diagnosis not present

## 2021-07-13 DIAGNOSIS — K219 Gastro-esophageal reflux disease without esophagitis: Secondary | ICD-10-CM | POA: Diagnosis not present

## 2021-07-13 DIAGNOSIS — M25512 Pain in left shoulder: Secondary | ICD-10-CM | POA: Diagnosis not present

## 2021-07-13 DIAGNOSIS — J939 Pneumothorax, unspecified: Secondary | ICD-10-CM | POA: Diagnosis not present

## 2021-07-13 DIAGNOSIS — S270XXA Traumatic pneumothorax, initial encounter: Secondary | ICD-10-CM | POA: Diagnosis not present

## 2021-07-13 DIAGNOSIS — Z4682 Encounter for fitting and adjustment of non-vascular catheter: Secondary | ICD-10-CM | POA: Diagnosis not present

## 2021-07-13 DIAGNOSIS — D6851 Activated protein C resistance: Secondary | ICD-10-CM | POA: Diagnosis not present

## 2021-07-16 DIAGNOSIS — J939 Pneumothorax, unspecified: Secondary | ICD-10-CM

## 2021-07-21 ENCOUNTER — Ambulatory Visit: Payer: Medicare HMO | Admitting: Podiatry

## 2021-07-29 DIAGNOSIS — G43909 Migraine, unspecified, not intractable, without status migrainosus: Secondary | ICD-10-CM | POA: Diagnosis not present

## 2021-07-29 DIAGNOSIS — M79604 Pain in right leg: Secondary | ICD-10-CM | POA: Diagnosis not present

## 2021-07-29 DIAGNOSIS — Z79891 Long term (current) use of opiate analgesic: Secondary | ICD-10-CM | POA: Diagnosis not present

## 2021-07-29 DIAGNOSIS — M961 Postlaminectomy syndrome, not elsewhere classified: Secondary | ICD-10-CM | POA: Diagnosis not present

## 2021-07-29 DIAGNOSIS — T7849XA Other allergy, initial encounter: Secondary | ICD-10-CM | POA: Diagnosis not present

## 2021-07-29 DIAGNOSIS — G8929 Other chronic pain: Secondary | ICD-10-CM | POA: Diagnosis not present

## 2021-07-29 DIAGNOSIS — M545 Low back pain, unspecified: Secondary | ICD-10-CM | POA: Diagnosis not present

## 2021-07-29 DIAGNOSIS — G894 Chronic pain syndrome: Secondary | ICD-10-CM | POA: Diagnosis not present

## 2021-07-29 DIAGNOSIS — M25561 Pain in right knee: Secondary | ICD-10-CM | POA: Diagnosis not present

## 2021-07-31 DIAGNOSIS — R0602 Shortness of breath: Secondary | ICD-10-CM | POA: Diagnosis not present

## 2021-07-31 DIAGNOSIS — J939 Pneumothorax, unspecified: Secondary | ICD-10-CM | POA: Diagnosis not present

## 2021-07-31 DIAGNOSIS — Z8709 Personal history of other diseases of the respiratory system: Secondary | ICD-10-CM | POA: Diagnosis not present

## 2021-08-26 DIAGNOSIS — M5137 Other intervertebral disc degeneration, lumbosacral region: Secondary | ICD-10-CM | POA: Diagnosis not present

## 2021-08-26 DIAGNOSIS — M79604 Pain in right leg: Secondary | ICD-10-CM | POA: Diagnosis not present

## 2021-08-26 DIAGNOSIS — G90523 Complex regional pain syndrome I of lower limb, bilateral: Secondary | ICD-10-CM | POA: Diagnosis not present

## 2021-08-26 DIAGNOSIS — M25561 Pain in right knee: Secondary | ICD-10-CM | POA: Diagnosis not present

## 2021-08-26 DIAGNOSIS — T7849XA Other allergy, initial encounter: Secondary | ICD-10-CM | POA: Diagnosis not present

## 2021-08-26 DIAGNOSIS — G894 Chronic pain syndrome: Secondary | ICD-10-CM | POA: Diagnosis not present

## 2021-08-26 DIAGNOSIS — M961 Postlaminectomy syndrome, not elsewhere classified: Secondary | ICD-10-CM | POA: Diagnosis not present

## 2021-08-26 DIAGNOSIS — Z79891 Long term (current) use of opiate analgesic: Secondary | ICD-10-CM | POA: Diagnosis not present

## 2021-08-26 DIAGNOSIS — M545 Low back pain, unspecified: Secondary | ICD-10-CM | POA: Diagnosis not present

## 2021-08-26 DIAGNOSIS — G8929 Other chronic pain: Secondary | ICD-10-CM | POA: Diagnosis not present

## 2021-09-18 DIAGNOSIS — D6851 Activated protein C resistance: Secondary | ICD-10-CM | POA: Diagnosis not present

## 2021-09-18 DIAGNOSIS — Z8709 Personal history of other diseases of the respiratory system: Secondary | ICD-10-CM | POA: Diagnosis not present

## 2021-09-18 DIAGNOSIS — E663 Overweight: Secondary | ICD-10-CM | POA: Diagnosis not present

## 2021-09-18 DIAGNOSIS — R0989 Other specified symptoms and signs involving the circulatory and respiratory systems: Secondary | ICD-10-CM | POA: Diagnosis not present

## 2021-09-18 DIAGNOSIS — D6869 Other thrombophilia: Secondary | ICD-10-CM | POA: Diagnosis not present

## 2021-09-18 DIAGNOSIS — Z6828 Body mass index (BMI) 28.0-28.9, adult: Secondary | ICD-10-CM | POA: Diagnosis not present

## 2021-09-18 DIAGNOSIS — S299XXS Unspecified injury of thorax, sequela: Secondary | ICD-10-CM | POA: Diagnosis not present

## 2021-09-18 DIAGNOSIS — Z1331 Encounter for screening for depression: Secondary | ICD-10-CM | POA: Diagnosis not present

## 2021-09-18 DIAGNOSIS — I25119 Atherosclerotic heart disease of native coronary artery with unspecified angina pectoris: Secondary | ICD-10-CM | POA: Diagnosis not present

## 2021-09-23 DIAGNOSIS — Z79891 Long term (current) use of opiate analgesic: Secondary | ICD-10-CM | POA: Diagnosis not present

## 2021-09-23 DIAGNOSIS — G43909 Migraine, unspecified, not intractable, without status migrainosus: Secondary | ICD-10-CM | POA: Diagnosis not present

## 2021-09-23 DIAGNOSIS — T7849XA Other allergy, initial encounter: Secondary | ICD-10-CM | POA: Diagnosis not present

## 2021-09-23 DIAGNOSIS — M545 Low back pain, unspecified: Secondary | ICD-10-CM | POA: Diagnosis not present

## 2021-09-23 DIAGNOSIS — G8929 Other chronic pain: Secondary | ICD-10-CM | POA: Diagnosis not present

## 2021-09-23 DIAGNOSIS — G894 Chronic pain syndrome: Secondary | ICD-10-CM | POA: Diagnosis not present

## 2021-09-23 DIAGNOSIS — M79604 Pain in right leg: Secondary | ICD-10-CM | POA: Diagnosis not present

## 2021-09-23 DIAGNOSIS — M25561 Pain in right knee: Secondary | ICD-10-CM | POA: Diagnosis not present

## 2021-10-21 DIAGNOSIS — M961 Postlaminectomy syndrome, not elsewhere classified: Secondary | ICD-10-CM | POA: Diagnosis not present

## 2021-10-21 DIAGNOSIS — M5137 Other intervertebral disc degeneration, lumbosacral region: Secondary | ICD-10-CM | POA: Diagnosis not present

## 2021-10-21 DIAGNOSIS — G8929 Other chronic pain: Secondary | ICD-10-CM | POA: Diagnosis not present

## 2021-10-21 DIAGNOSIS — G43909 Migraine, unspecified, not intractable, without status migrainosus: Secondary | ICD-10-CM | POA: Diagnosis not present

## 2021-10-21 DIAGNOSIS — Z79891 Long term (current) use of opiate analgesic: Secondary | ICD-10-CM | POA: Diagnosis not present

## 2021-10-21 DIAGNOSIS — M79604 Pain in right leg: Secondary | ICD-10-CM | POA: Diagnosis not present

## 2021-10-21 DIAGNOSIS — G894 Chronic pain syndrome: Secondary | ICD-10-CM | POA: Diagnosis not present

## 2021-10-21 DIAGNOSIS — M25561 Pain in right knee: Secondary | ICD-10-CM | POA: Diagnosis not present

## 2021-10-21 DIAGNOSIS — M545 Low back pain, unspecified: Secondary | ICD-10-CM | POA: Diagnosis not present

## 2021-10-21 DIAGNOSIS — T7849XA Other allergy, initial encounter: Secondary | ICD-10-CM | POA: Diagnosis not present

## 2021-10-24 DIAGNOSIS — L2089 Other atopic dermatitis: Secondary | ICD-10-CM | POA: Diagnosis not present

## 2021-10-24 DIAGNOSIS — R21 Rash and other nonspecific skin eruption: Secondary | ICD-10-CM | POA: Diagnosis not present

## 2021-11-04 ENCOUNTER — Other Ambulatory Visit: Payer: Self-pay | Admitting: Allergy and Immunology

## 2021-11-04 DIAGNOSIS — L309 Dermatitis, unspecified: Secondary | ICD-10-CM | POA: Diagnosis not present

## 2021-11-04 DIAGNOSIS — H5789 Other specified disorders of eye and adnexa: Secondary | ICD-10-CM | POA: Diagnosis not present

## 2021-11-04 DIAGNOSIS — Z6829 Body mass index (BMI) 29.0-29.9, adult: Secondary | ICD-10-CM | POA: Diagnosis not present

## 2021-11-12 DIAGNOSIS — D6851 Activated protein C resistance: Secondary | ICD-10-CM | POA: Diagnosis not present

## 2021-11-12 DIAGNOSIS — D6869 Other thrombophilia: Secondary | ICD-10-CM | POA: Diagnosis not present

## 2021-11-12 DIAGNOSIS — E663 Overweight: Secondary | ICD-10-CM | POA: Diagnosis not present

## 2021-11-12 DIAGNOSIS — I25119 Atherosclerotic heart disease of native coronary artery with unspecified angina pectoris: Secondary | ICD-10-CM | POA: Diagnosis not present

## 2021-11-12 DIAGNOSIS — I700 Atherosclerosis of aorta: Secondary | ICD-10-CM | POA: Diagnosis not present

## 2021-11-12 DIAGNOSIS — Z6829 Body mass index (BMI) 29.0-29.9, adult: Secondary | ICD-10-CM | POA: Diagnosis not present

## 2021-11-12 DIAGNOSIS — L508 Other urticaria: Secondary | ICD-10-CM | POA: Diagnosis not present

## 2021-11-12 DIAGNOSIS — R0989 Other specified symptoms and signs involving the circulatory and respiratory systems: Secondary | ICD-10-CM | POA: Diagnosis not present

## 2021-11-12 DIAGNOSIS — H1045 Other chronic allergic conjunctivitis: Secondary | ICD-10-CM | POA: Diagnosis not present

## 2021-11-12 DIAGNOSIS — J301 Allergic rhinitis due to pollen: Secondary | ICD-10-CM | POA: Diagnosis not present

## 2021-11-19 DIAGNOSIS — M5136 Other intervertebral disc degeneration, lumbar region: Secondary | ICD-10-CM | POA: Diagnosis not present

## 2021-11-19 DIAGNOSIS — M545 Low back pain, unspecified: Secondary | ICD-10-CM | POA: Diagnosis not present

## 2021-12-02 DIAGNOSIS — M961 Postlaminectomy syndrome, not elsewhere classified: Secondary | ICD-10-CM | POA: Diagnosis not present

## 2021-12-02 DIAGNOSIS — M5137 Other intervertebral disc degeneration, lumbosacral region: Secondary | ICD-10-CM | POA: Diagnosis not present

## 2021-12-02 DIAGNOSIS — M25561 Pain in right knee: Secondary | ICD-10-CM | POA: Diagnosis not present

## 2021-12-02 DIAGNOSIS — M79604 Pain in right leg: Secondary | ICD-10-CM | POA: Diagnosis not present

## 2021-12-02 DIAGNOSIS — G894 Chronic pain syndrome: Secondary | ICD-10-CM | POA: Diagnosis not present

## 2021-12-02 DIAGNOSIS — G8929 Other chronic pain: Secondary | ICD-10-CM | POA: Diagnosis not present

## 2021-12-02 DIAGNOSIS — Z79891 Long term (current) use of opiate analgesic: Secondary | ICD-10-CM | POA: Diagnosis not present

## 2021-12-02 DIAGNOSIS — M545 Low back pain, unspecified: Secondary | ICD-10-CM | POA: Diagnosis not present

## 2021-12-02 DIAGNOSIS — G43909 Migraine, unspecified, not intractable, without status migrainosus: Secondary | ICD-10-CM | POA: Diagnosis not present

## 2021-12-12 DIAGNOSIS — G4700 Insomnia, unspecified: Secondary | ICD-10-CM | POA: Diagnosis not present

## 2021-12-12 DIAGNOSIS — L508 Other urticaria: Secondary | ICD-10-CM | POA: Diagnosis not present

## 2021-12-12 DIAGNOSIS — Z6829 Body mass index (BMI) 29.0-29.9, adult: Secondary | ICD-10-CM | POA: Diagnosis not present

## 2021-12-23 DIAGNOSIS — T7849XA Other allergy, initial encounter: Secondary | ICD-10-CM | POA: Diagnosis not present

## 2021-12-23 DIAGNOSIS — M25561 Pain in right knee: Secondary | ICD-10-CM | POA: Diagnosis not present

## 2021-12-23 DIAGNOSIS — Z79891 Long term (current) use of opiate analgesic: Secondary | ICD-10-CM | POA: Diagnosis not present

## 2021-12-23 DIAGNOSIS — M5137 Other intervertebral disc degeneration, lumbosacral region: Secondary | ICD-10-CM | POA: Diagnosis not present

## 2021-12-23 DIAGNOSIS — G8929 Other chronic pain: Secondary | ICD-10-CM | POA: Diagnosis not present

## 2021-12-23 DIAGNOSIS — G43909 Migraine, unspecified, not intractable, without status migrainosus: Secondary | ICD-10-CM | POA: Diagnosis not present

## 2021-12-23 DIAGNOSIS — G894 Chronic pain syndrome: Secondary | ICD-10-CM | POA: Diagnosis not present

## 2021-12-23 DIAGNOSIS — M961 Postlaminectomy syndrome, not elsewhere classified: Secondary | ICD-10-CM | POA: Diagnosis not present

## 2021-12-23 DIAGNOSIS — M79604 Pain in right leg: Secondary | ICD-10-CM | POA: Diagnosis not present

## 2021-12-23 DIAGNOSIS — M545 Low back pain, unspecified: Secondary | ICD-10-CM | POA: Diagnosis not present

## 2021-12-26 DIAGNOSIS — H5712 Ocular pain, left eye: Secondary | ICD-10-CM | POA: Diagnosis not present

## 2022-01-16 DIAGNOSIS — Z6829 Body mass index (BMI) 29.0-29.9, adult: Secondary | ICD-10-CM | POA: Diagnosis not present

## 2022-01-16 DIAGNOSIS — L508 Other urticaria: Secondary | ICD-10-CM | POA: Diagnosis not present

## 2022-01-27 DIAGNOSIS — Z79891 Long term (current) use of opiate analgesic: Secondary | ICD-10-CM | POA: Diagnosis not present

## 2022-01-27 DIAGNOSIS — M25561 Pain in right knee: Secondary | ICD-10-CM | POA: Diagnosis not present

## 2022-01-27 DIAGNOSIS — M79604 Pain in right leg: Secondary | ICD-10-CM | POA: Diagnosis not present

## 2022-01-27 DIAGNOSIS — M961 Postlaminectomy syndrome, not elsewhere classified: Secondary | ICD-10-CM | POA: Diagnosis not present

## 2022-01-27 DIAGNOSIS — G8929 Other chronic pain: Secondary | ICD-10-CM | POA: Diagnosis not present

## 2022-01-27 DIAGNOSIS — M545 Low back pain, unspecified: Secondary | ICD-10-CM | POA: Diagnosis not present

## 2022-01-27 DIAGNOSIS — T7849XA Other allergy, initial encounter: Secondary | ICD-10-CM | POA: Diagnosis not present

## 2022-01-27 DIAGNOSIS — G43909 Migraine, unspecified, not intractable, without status migrainosus: Secondary | ICD-10-CM | POA: Diagnosis not present

## 2022-01-27 DIAGNOSIS — G894 Chronic pain syndrome: Secondary | ICD-10-CM | POA: Diagnosis not present

## 2022-01-27 DIAGNOSIS — M5137 Other intervertebral disc degeneration, lumbosacral region: Secondary | ICD-10-CM | POA: Diagnosis not present

## 2022-01-28 DIAGNOSIS — L508 Other urticaria: Secondary | ICD-10-CM | POA: Diagnosis not present

## 2022-01-28 DIAGNOSIS — Z6829 Body mass index (BMI) 29.0-29.9, adult: Secondary | ICD-10-CM | POA: Diagnosis not present

## 2022-02-02 DIAGNOSIS — Z6828 Body mass index (BMI) 28.0-28.9, adult: Secondary | ICD-10-CM | POA: Diagnosis not present

## 2022-02-02 DIAGNOSIS — E663 Overweight: Secondary | ICD-10-CM | POA: Diagnosis not present

## 2022-02-02 DIAGNOSIS — R69 Illness, unspecified: Secondary | ICD-10-CM | POA: Diagnosis not present

## 2022-02-02 DIAGNOSIS — L508 Other urticaria: Secondary | ICD-10-CM | POA: Diagnosis not present

## 2022-02-24 DIAGNOSIS — M5137 Other intervertebral disc degeneration, lumbosacral region: Secondary | ICD-10-CM | POA: Diagnosis not present

## 2022-02-24 DIAGNOSIS — T7849XA Other allergy, initial encounter: Secondary | ICD-10-CM | POA: Diagnosis not present

## 2022-02-24 DIAGNOSIS — Z79891 Long term (current) use of opiate analgesic: Secondary | ICD-10-CM | POA: Diagnosis not present

## 2022-02-24 DIAGNOSIS — G894 Chronic pain syndrome: Secondary | ICD-10-CM | POA: Diagnosis not present

## 2022-02-24 DIAGNOSIS — M545 Low back pain, unspecified: Secondary | ICD-10-CM | POA: Diagnosis not present

## 2022-02-24 DIAGNOSIS — M961 Postlaminectomy syndrome, not elsewhere classified: Secondary | ICD-10-CM | POA: Diagnosis not present

## 2022-02-24 DIAGNOSIS — G8929 Other chronic pain: Secondary | ICD-10-CM | POA: Diagnosis not present

## 2022-02-24 DIAGNOSIS — M79604 Pain in right leg: Secondary | ICD-10-CM | POA: Diagnosis not present

## 2022-02-24 DIAGNOSIS — G43909 Migraine, unspecified, not intractable, without status migrainosus: Secondary | ICD-10-CM | POA: Diagnosis not present

## 2022-02-24 DIAGNOSIS — M25561 Pain in right knee: Secondary | ICD-10-CM | POA: Diagnosis not present

## 2022-03-17 DIAGNOSIS — J301 Allergic rhinitis due to pollen: Secondary | ICD-10-CM | POA: Diagnosis not present

## 2022-03-17 DIAGNOSIS — H1045 Other chronic allergic conjunctivitis: Secondary | ICD-10-CM | POA: Diagnosis not present

## 2022-03-17 DIAGNOSIS — M5137 Other intervertebral disc degeneration, lumbosacral region: Secondary | ICD-10-CM | POA: Diagnosis not present

## 2022-03-17 DIAGNOSIS — M961 Postlaminectomy syndrome, not elsewhere classified: Secondary | ICD-10-CM | POA: Diagnosis not present

## 2022-03-17 DIAGNOSIS — M1A09X0 Idiopathic chronic gout, multiple sites, without tophus (tophi): Secondary | ICD-10-CM | POA: Diagnosis not present

## 2022-03-17 DIAGNOSIS — L508 Other urticaria: Secondary | ICD-10-CM | POA: Diagnosis not present

## 2022-03-17 DIAGNOSIS — Z6829 Body mass index (BMI) 29.0-29.9, adult: Secondary | ICD-10-CM | POA: Diagnosis not present

## 2022-03-24 DIAGNOSIS — M940 Chondrocostal junction syndrome [Tietze]: Secondary | ICD-10-CM | POA: Diagnosis not present

## 2022-04-02 DIAGNOSIS — T7849XA Other allergy, initial encounter: Secondary | ICD-10-CM | POA: Diagnosis not present

## 2022-04-02 DIAGNOSIS — Z79891 Long term (current) use of opiate analgesic: Secondary | ICD-10-CM | POA: Diagnosis not present

## 2022-04-02 DIAGNOSIS — G8929 Other chronic pain: Secondary | ICD-10-CM | POA: Diagnosis not present

## 2022-04-02 DIAGNOSIS — M79604 Pain in right leg: Secondary | ICD-10-CM | POA: Diagnosis not present

## 2022-04-02 DIAGNOSIS — M5137 Other intervertebral disc degeneration, lumbosacral region: Secondary | ICD-10-CM | POA: Diagnosis not present

## 2022-04-02 DIAGNOSIS — G43909 Migraine, unspecified, not intractable, without status migrainosus: Secondary | ICD-10-CM | POA: Diagnosis not present

## 2022-04-02 DIAGNOSIS — M25561 Pain in right knee: Secondary | ICD-10-CM | POA: Diagnosis not present

## 2022-04-02 DIAGNOSIS — M961 Postlaminectomy syndrome, not elsewhere classified: Secondary | ICD-10-CM | POA: Diagnosis not present

## 2022-04-02 DIAGNOSIS — M545 Low back pain, unspecified: Secondary | ICD-10-CM | POA: Diagnosis not present

## 2022-04-02 DIAGNOSIS — G894 Chronic pain syndrome: Secondary | ICD-10-CM | POA: Diagnosis not present

## 2022-04-27 DIAGNOSIS — M545 Low back pain, unspecified: Secondary | ICD-10-CM | POA: Diagnosis not present

## 2022-04-27 DIAGNOSIS — G8929 Other chronic pain: Secondary | ICD-10-CM | POA: Diagnosis not present

## 2022-04-27 DIAGNOSIS — G894 Chronic pain syndrome: Secondary | ICD-10-CM | POA: Diagnosis not present

## 2022-04-27 DIAGNOSIS — T7849XA Other allergy, initial encounter: Secondary | ICD-10-CM | POA: Diagnosis not present

## 2022-04-27 DIAGNOSIS — M25561 Pain in right knee: Secondary | ICD-10-CM | POA: Diagnosis not present

## 2022-04-27 DIAGNOSIS — M961 Postlaminectomy syndrome, not elsewhere classified: Secondary | ICD-10-CM | POA: Diagnosis not present

## 2022-04-27 DIAGNOSIS — Z79891 Long term (current) use of opiate analgesic: Secondary | ICD-10-CM | POA: Diagnosis not present

## 2022-04-27 DIAGNOSIS — M79604 Pain in right leg: Secondary | ICD-10-CM | POA: Diagnosis not present

## 2022-04-27 DIAGNOSIS — M5137 Other intervertebral disc degeneration, lumbosacral region: Secondary | ICD-10-CM | POA: Diagnosis not present

## 2022-05-25 DIAGNOSIS — T7849XA Other allergy, initial encounter: Secondary | ICD-10-CM | POA: Diagnosis not present

## 2022-05-25 DIAGNOSIS — M25561 Pain in right knee: Secondary | ICD-10-CM | POA: Diagnosis not present

## 2022-05-25 DIAGNOSIS — M961 Postlaminectomy syndrome, not elsewhere classified: Secondary | ICD-10-CM | POA: Diagnosis not present

## 2022-05-25 DIAGNOSIS — M5137 Other intervertebral disc degeneration, lumbosacral region: Secondary | ICD-10-CM | POA: Diagnosis not present

## 2022-05-25 DIAGNOSIS — G8929 Other chronic pain: Secondary | ICD-10-CM | POA: Diagnosis not present

## 2022-05-25 DIAGNOSIS — G894 Chronic pain syndrome: Secondary | ICD-10-CM | POA: Diagnosis not present

## 2022-05-25 DIAGNOSIS — M79604 Pain in right leg: Secondary | ICD-10-CM | POA: Diagnosis not present

## 2022-05-25 DIAGNOSIS — M545 Low back pain, unspecified: Secondary | ICD-10-CM | POA: Diagnosis not present

## 2022-05-25 DIAGNOSIS — Z79891 Long term (current) use of opiate analgesic: Secondary | ICD-10-CM | POA: Diagnosis not present

## 2022-06-02 ENCOUNTER — Other Ambulatory Visit: Payer: Self-pay | Admitting: Allergy and Immunology

## 2022-06-11 DIAGNOSIS — Z0000 Encounter for general adult medical examination without abnormal findings: Secondary | ICD-10-CM | POA: Diagnosis not present

## 2022-06-11 DIAGNOSIS — E782 Mixed hyperlipidemia: Secondary | ICD-10-CM | POA: Diagnosis not present

## 2022-06-11 DIAGNOSIS — I700 Atherosclerosis of aorta: Secondary | ICD-10-CM | POA: Diagnosis not present

## 2022-06-11 DIAGNOSIS — R7301 Impaired fasting glucose: Secondary | ICD-10-CM | POA: Diagnosis not present

## 2022-06-11 DIAGNOSIS — I25119 Atherosclerotic heart disease of native coronary artery with unspecified angina pectoris: Secondary | ICD-10-CM | POA: Diagnosis not present

## 2022-06-11 DIAGNOSIS — D6851 Activated protein C resistance: Secondary | ICD-10-CM | POA: Diagnosis not present

## 2022-06-11 DIAGNOSIS — Z125 Encounter for screening for malignant neoplasm of prostate: Secondary | ICD-10-CM | POA: Diagnosis not present

## 2022-06-11 DIAGNOSIS — J340 Abscess, furuncle and carbuncle of nose: Secondary | ICD-10-CM | POA: Diagnosis not present

## 2022-06-11 DIAGNOSIS — M1A09X0 Idiopathic chronic gout, multiple sites, without tophus (tophi): Secondary | ICD-10-CM | POA: Diagnosis not present

## 2022-06-11 DIAGNOSIS — L508 Other urticaria: Secondary | ICD-10-CM | POA: Diagnosis not present

## 2022-06-11 DIAGNOSIS — J301 Allergic rhinitis due to pollen: Secondary | ICD-10-CM | POA: Diagnosis not present

## 2022-06-11 DIAGNOSIS — H1045 Other chronic allergic conjunctivitis: Secondary | ICD-10-CM | POA: Diagnosis not present

## 2022-06-23 DIAGNOSIS — M25561 Pain in right knee: Secondary | ICD-10-CM | POA: Diagnosis not present

## 2022-06-23 DIAGNOSIS — Z79891 Long term (current) use of opiate analgesic: Secondary | ICD-10-CM | POA: Diagnosis not present

## 2022-06-23 DIAGNOSIS — M545 Low back pain, unspecified: Secondary | ICD-10-CM | POA: Diagnosis not present

## 2022-06-23 DIAGNOSIS — T7849XA Other allergy, initial encounter: Secondary | ICD-10-CM | POA: Diagnosis not present

## 2022-06-23 DIAGNOSIS — M5137 Other intervertebral disc degeneration, lumbosacral region: Secondary | ICD-10-CM | POA: Diagnosis not present

## 2022-06-23 DIAGNOSIS — M961 Postlaminectomy syndrome, not elsewhere classified: Secondary | ICD-10-CM | POA: Diagnosis not present

## 2022-06-23 DIAGNOSIS — G8929 Other chronic pain: Secondary | ICD-10-CM | POA: Diagnosis not present

## 2022-06-23 DIAGNOSIS — G894 Chronic pain syndrome: Secondary | ICD-10-CM | POA: Diagnosis not present

## 2022-06-23 DIAGNOSIS — M79604 Pain in right leg: Secondary | ICD-10-CM | POA: Diagnosis not present

## 2022-07-28 DIAGNOSIS — G894 Chronic pain syndrome: Secondary | ICD-10-CM | POA: Diagnosis not present

## 2022-07-28 DIAGNOSIS — M961 Postlaminectomy syndrome, not elsewhere classified: Secondary | ICD-10-CM | POA: Diagnosis not present

## 2022-07-28 DIAGNOSIS — M5137 Other intervertebral disc degeneration, lumbosacral region: Secondary | ICD-10-CM | POA: Diagnosis not present

## 2022-07-28 DIAGNOSIS — G8929 Other chronic pain: Secondary | ICD-10-CM | POA: Diagnosis not present

## 2022-07-28 DIAGNOSIS — M545 Low back pain, unspecified: Secondary | ICD-10-CM | POA: Diagnosis not present

## 2022-07-28 DIAGNOSIS — M79604 Pain in right leg: Secondary | ICD-10-CM | POA: Diagnosis not present

## 2022-07-28 DIAGNOSIS — M25561 Pain in right knee: Secondary | ICD-10-CM | POA: Diagnosis not present

## 2022-07-28 DIAGNOSIS — T7849XA Other allergy, initial encounter: Secondary | ICD-10-CM | POA: Diagnosis not present

## 2022-07-28 DIAGNOSIS — Z79891 Long term (current) use of opiate analgesic: Secondary | ICD-10-CM | POA: Diagnosis not present

## 2022-08-21 DIAGNOSIS — J3489 Other specified disorders of nose and nasal sinuses: Secondary | ICD-10-CM | POA: Diagnosis not present

## 2022-08-21 DIAGNOSIS — D6851 Activated protein C resistance: Secondary | ICD-10-CM | POA: Diagnosis not present

## 2022-08-21 DIAGNOSIS — J343 Hypertrophy of nasal turbinates: Secondary | ICD-10-CM | POA: Diagnosis not present

## 2022-08-21 DIAGNOSIS — J342 Deviated nasal septum: Secondary | ICD-10-CM | POA: Diagnosis not present

## 2022-08-25 DIAGNOSIS — Z79891 Long term (current) use of opiate analgesic: Secondary | ICD-10-CM | POA: Diagnosis not present

## 2022-08-25 DIAGNOSIS — G894 Chronic pain syndrome: Secondary | ICD-10-CM | POA: Diagnosis not present

## 2022-08-28 DIAGNOSIS — H1045 Other chronic allergic conjunctivitis: Secondary | ICD-10-CM | POA: Diagnosis not present

## 2022-08-28 DIAGNOSIS — R69 Illness, unspecified: Secondary | ICD-10-CM | POA: Diagnosis not present

## 2022-08-28 DIAGNOSIS — H10233 Serous conjunctivitis, except viral, bilateral: Secondary | ICD-10-CM | POA: Diagnosis not present

## 2022-08-28 DIAGNOSIS — Z6830 Body mass index (BMI) 30.0-30.9, adult: Secondary | ICD-10-CM | POA: Diagnosis not present

## 2022-09-22 DIAGNOSIS — Z79891 Long term (current) use of opiate analgesic: Secondary | ICD-10-CM | POA: Diagnosis not present

## 2022-09-22 DIAGNOSIS — T7849XA Other allergy, initial encounter: Secondary | ICD-10-CM | POA: Diagnosis not present

## 2022-09-22 DIAGNOSIS — M79604 Pain in right leg: Secondary | ICD-10-CM | POA: Diagnosis not present

## 2022-09-22 DIAGNOSIS — M25561 Pain in right knee: Secondary | ICD-10-CM | POA: Diagnosis not present

## 2022-09-22 DIAGNOSIS — M545 Low back pain, unspecified: Secondary | ICD-10-CM | POA: Diagnosis not present

## 2022-09-22 DIAGNOSIS — G43909 Migraine, unspecified, not intractable, without status migrainosus: Secondary | ICD-10-CM | POA: Diagnosis not present

## 2022-09-22 DIAGNOSIS — G8929 Other chronic pain: Secondary | ICD-10-CM | POA: Diagnosis not present

## 2022-09-22 DIAGNOSIS — M5137 Other intervertebral disc degeneration, lumbosacral region: Secondary | ICD-10-CM | POA: Diagnosis not present

## 2022-09-22 DIAGNOSIS — M961 Postlaminectomy syndrome, not elsewhere classified: Secondary | ICD-10-CM | POA: Diagnosis not present

## 2022-09-22 DIAGNOSIS — G894 Chronic pain syndrome: Secondary | ICD-10-CM | POA: Diagnosis not present

## 2022-10-20 DIAGNOSIS — T7849XA Other allergy, initial encounter: Secondary | ICD-10-CM | POA: Diagnosis not present

## 2022-10-20 DIAGNOSIS — G43909 Migraine, unspecified, not intractable, without status migrainosus: Secondary | ICD-10-CM | POA: Diagnosis not present

## 2022-10-20 DIAGNOSIS — M79604 Pain in right leg: Secondary | ICD-10-CM | POA: Diagnosis not present

## 2022-10-20 DIAGNOSIS — M545 Low back pain, unspecified: Secondary | ICD-10-CM | POA: Diagnosis not present

## 2022-10-20 DIAGNOSIS — G8929 Other chronic pain: Secondary | ICD-10-CM | POA: Diagnosis not present

## 2022-10-20 DIAGNOSIS — M25561 Pain in right knee: Secondary | ICD-10-CM | POA: Diagnosis not present

## 2022-10-20 DIAGNOSIS — M961 Postlaminectomy syndrome, not elsewhere classified: Secondary | ICD-10-CM | POA: Diagnosis not present

## 2022-10-20 DIAGNOSIS — G894 Chronic pain syndrome: Secondary | ICD-10-CM | POA: Diagnosis not present

## 2022-10-20 DIAGNOSIS — Z79891 Long term (current) use of opiate analgesic: Secondary | ICD-10-CM | POA: Diagnosis not present

## 2022-11-17 DIAGNOSIS — M25561 Pain in right knee: Secondary | ICD-10-CM | POA: Diagnosis not present

## 2022-11-17 DIAGNOSIS — M961 Postlaminectomy syndrome, not elsewhere classified: Secondary | ICD-10-CM | POA: Diagnosis not present

## 2022-11-17 DIAGNOSIS — M5137 Other intervertebral disc degeneration, lumbosacral region: Secondary | ICD-10-CM | POA: Diagnosis not present

## 2022-11-17 DIAGNOSIS — M79604 Pain in right leg: Secondary | ICD-10-CM | POA: Diagnosis not present

## 2022-11-17 DIAGNOSIS — G8929 Other chronic pain: Secondary | ICD-10-CM | POA: Diagnosis not present

## 2022-11-17 DIAGNOSIS — G894 Chronic pain syndrome: Secondary | ICD-10-CM | POA: Diagnosis not present

## 2022-11-17 DIAGNOSIS — T7849XA Other allergy, initial encounter: Secondary | ICD-10-CM | POA: Diagnosis not present

## 2022-11-17 DIAGNOSIS — M545 Low back pain, unspecified: Secondary | ICD-10-CM | POA: Diagnosis not present

## 2022-11-17 DIAGNOSIS — Z79891 Long term (current) use of opiate analgesic: Secondary | ICD-10-CM | POA: Diagnosis not present

## 2022-11-17 DIAGNOSIS — G43909 Migraine, unspecified, not intractable, without status migrainosus: Secondary | ICD-10-CM | POA: Diagnosis not present

## 2022-12-17 DIAGNOSIS — Z79891 Long term (current) use of opiate analgesic: Secondary | ICD-10-CM | POA: Diagnosis not present

## 2022-12-17 DIAGNOSIS — G894 Chronic pain syndrome: Secondary | ICD-10-CM | POA: Diagnosis not present

## 2022-12-17 DIAGNOSIS — M25561 Pain in right knee: Secondary | ICD-10-CM | POA: Diagnosis not present

## 2022-12-17 DIAGNOSIS — G8929 Other chronic pain: Secondary | ICD-10-CM | POA: Diagnosis not present

## 2022-12-17 DIAGNOSIS — M545 Low back pain, unspecified: Secondary | ICD-10-CM | POA: Diagnosis not present

## 2022-12-17 DIAGNOSIS — M961 Postlaminectomy syndrome, not elsewhere classified: Secondary | ICD-10-CM | POA: Diagnosis not present

## 2022-12-17 DIAGNOSIS — T7849XA Other allergy, initial encounter: Secondary | ICD-10-CM | POA: Diagnosis not present

## 2022-12-17 DIAGNOSIS — M79604 Pain in right leg: Secondary | ICD-10-CM | POA: Diagnosis not present

## 2022-12-17 DIAGNOSIS — M5137 Other intervertebral disc degeneration, lumbosacral region: Secondary | ICD-10-CM | POA: Diagnosis not present

## 2023-01-18 DIAGNOSIS — Z6831 Body mass index (BMI) 31.0-31.9, adult: Secondary | ICD-10-CM | POA: Diagnosis not present

## 2023-01-18 DIAGNOSIS — D6851 Activated protein C resistance: Secondary | ICD-10-CM | POA: Diagnosis not present

## 2023-01-18 DIAGNOSIS — F4389 Other reactions to severe stress: Secondary | ICD-10-CM | POA: Diagnosis not present

## 2023-01-18 DIAGNOSIS — I700 Atherosclerosis of aorta: Secondary | ICD-10-CM | POA: Diagnosis not present

## 2023-01-18 DIAGNOSIS — R0989 Other specified symptoms and signs involving the circulatory and respiratory systems: Secondary | ICD-10-CM | POA: Diagnosis not present

## 2023-01-18 DIAGNOSIS — I25119 Atherosclerotic heart disease of native coronary artery with unspecified angina pectoris: Secondary | ICD-10-CM | POA: Diagnosis not present

## 2023-01-18 DIAGNOSIS — Z1339 Encounter for screening examination for other mental health and behavioral disorders: Secondary | ICD-10-CM | POA: Diagnosis not present

## 2023-01-18 DIAGNOSIS — F411 Generalized anxiety disorder: Secondary | ICD-10-CM | POA: Diagnosis not present

## 2023-01-18 DIAGNOSIS — E669 Obesity, unspecified: Secondary | ICD-10-CM | POA: Diagnosis not present

## 2023-01-18 DIAGNOSIS — Z1331 Encounter for screening for depression: Secondary | ICD-10-CM | POA: Diagnosis not present

## 2023-01-18 DIAGNOSIS — D6869 Other thrombophilia: Secondary | ICD-10-CM | POA: Diagnosis not present

## 2023-01-19 DIAGNOSIS — G43909 Migraine, unspecified, not intractable, without status migrainosus: Secondary | ICD-10-CM | POA: Diagnosis not present

## 2023-01-19 DIAGNOSIS — T7849XA Other allergy, initial encounter: Secondary | ICD-10-CM | POA: Diagnosis not present

## 2023-01-19 DIAGNOSIS — M79604 Pain in right leg: Secondary | ICD-10-CM | POA: Diagnosis not present

## 2023-01-19 DIAGNOSIS — M5137 Other intervertebral disc degeneration, lumbosacral region: Secondary | ICD-10-CM | POA: Diagnosis not present

## 2023-01-19 DIAGNOSIS — G894 Chronic pain syndrome: Secondary | ICD-10-CM | POA: Diagnosis not present

## 2023-01-19 DIAGNOSIS — M961 Postlaminectomy syndrome, not elsewhere classified: Secondary | ICD-10-CM | POA: Diagnosis not present

## 2023-01-19 DIAGNOSIS — M545 Low back pain, unspecified: Secondary | ICD-10-CM | POA: Diagnosis not present

## 2023-01-19 DIAGNOSIS — M25561 Pain in right knee: Secondary | ICD-10-CM | POA: Diagnosis not present

## 2023-01-19 DIAGNOSIS — G8929 Other chronic pain: Secondary | ICD-10-CM | POA: Diagnosis not present

## 2023-01-19 DIAGNOSIS — Z79899 Other long term (current) drug therapy: Secondary | ICD-10-CM | POA: Diagnosis not present

## 2023-01-19 DIAGNOSIS — Z79891 Long term (current) use of opiate analgesic: Secondary | ICD-10-CM | POA: Diagnosis not present

## 2023-02-16 DIAGNOSIS — Z79891 Long term (current) use of opiate analgesic: Secondary | ICD-10-CM | POA: Diagnosis not present

## 2023-02-16 DIAGNOSIS — T7849XA Other allergy, initial encounter: Secondary | ICD-10-CM | POA: Diagnosis not present

## 2023-02-16 DIAGNOSIS — G90523 Complex regional pain syndrome I of lower limb, bilateral: Secondary | ICD-10-CM | POA: Diagnosis not present

## 2023-02-16 DIAGNOSIS — G8929 Other chronic pain: Secondary | ICD-10-CM | POA: Diagnosis not present

## 2023-02-16 DIAGNOSIS — G894 Chronic pain syndrome: Secondary | ICD-10-CM | POA: Diagnosis not present

## 2023-02-16 DIAGNOSIS — Z79899 Other long term (current) drug therapy: Secondary | ICD-10-CM | POA: Diagnosis not present

## 2023-02-16 DIAGNOSIS — M5137 Other intervertebral disc degeneration, lumbosacral region: Secondary | ICD-10-CM | POA: Diagnosis not present

## 2023-02-16 DIAGNOSIS — M79604 Pain in right leg: Secondary | ICD-10-CM | POA: Diagnosis not present

## 2023-02-16 DIAGNOSIS — M545 Low back pain, unspecified: Secondary | ICD-10-CM | POA: Diagnosis not present

## 2023-02-16 DIAGNOSIS — M961 Postlaminectomy syndrome, not elsewhere classified: Secondary | ICD-10-CM | POA: Diagnosis not present

## 2023-02-16 DIAGNOSIS — M25561 Pain in right knee: Secondary | ICD-10-CM | POA: Diagnosis not present

## 2023-03-18 DIAGNOSIS — G894 Chronic pain syndrome: Secondary | ICD-10-CM | POA: Diagnosis not present

## 2023-03-18 DIAGNOSIS — G8929 Other chronic pain: Secondary | ICD-10-CM | POA: Diagnosis not present

## 2023-03-18 DIAGNOSIS — M545 Low back pain, unspecified: Secondary | ICD-10-CM | POA: Diagnosis not present

## 2023-03-18 DIAGNOSIS — G43909 Migraine, unspecified, not intractable, without status migrainosus: Secondary | ICD-10-CM | POA: Diagnosis not present

## 2023-03-18 DIAGNOSIS — M79604 Pain in right leg: Secondary | ICD-10-CM | POA: Diagnosis not present

## 2023-03-18 DIAGNOSIS — M5137 Other intervertebral disc degeneration, lumbosacral region: Secondary | ICD-10-CM | POA: Diagnosis not present

## 2023-03-18 DIAGNOSIS — Z79899 Other long term (current) drug therapy: Secondary | ICD-10-CM | POA: Diagnosis not present

## 2023-03-18 DIAGNOSIS — T7849XA Other allergy, initial encounter: Secondary | ICD-10-CM | POA: Diagnosis not present

## 2023-03-18 DIAGNOSIS — M25561 Pain in right knee: Secondary | ICD-10-CM | POA: Diagnosis not present

## 2023-03-18 DIAGNOSIS — M961 Postlaminectomy syndrome, not elsewhere classified: Secondary | ICD-10-CM | POA: Diagnosis not present

## 2023-03-18 DIAGNOSIS — Z79891 Long term (current) use of opiate analgesic: Secondary | ICD-10-CM | POA: Diagnosis not present

## 2023-03-29 DIAGNOSIS — D6869 Other thrombophilia: Secondary | ICD-10-CM | POA: Diagnosis not present

## 2023-03-29 DIAGNOSIS — E669 Obesity, unspecified: Secondary | ICD-10-CM | POA: Diagnosis not present

## 2023-03-29 DIAGNOSIS — I25119 Atherosclerotic heart disease of native coronary artery with unspecified angina pectoris: Secondary | ICD-10-CM | POA: Diagnosis not present

## 2023-03-29 DIAGNOSIS — F411 Generalized anxiety disorder: Secondary | ICD-10-CM | POA: Diagnosis not present

## 2023-03-29 DIAGNOSIS — I700 Atherosclerosis of aorta: Secondary | ICD-10-CM | POA: Diagnosis not present

## 2023-03-29 DIAGNOSIS — M961 Postlaminectomy syndrome, not elsewhere classified: Secondary | ICD-10-CM | POA: Diagnosis not present

## 2023-03-29 DIAGNOSIS — D6851 Activated protein C resistance: Secondary | ICD-10-CM | POA: Diagnosis not present

## 2023-03-29 DIAGNOSIS — G4700 Insomnia, unspecified: Secondary | ICD-10-CM | POA: Diagnosis not present

## 2023-03-29 DIAGNOSIS — G43909 Migraine, unspecified, not intractable, without status migrainosus: Secondary | ICD-10-CM | POA: Diagnosis not present

## 2023-03-29 DIAGNOSIS — E782 Mixed hyperlipidemia: Secondary | ICD-10-CM | POA: Diagnosis not present

## 2023-03-29 DIAGNOSIS — F4389 Other reactions to severe stress: Secondary | ICD-10-CM | POA: Diagnosis not present

## 2023-03-29 DIAGNOSIS — R0989 Other specified symptoms and signs involving the circulatory and respiratory systems: Secondary | ICD-10-CM | POA: Diagnosis not present

## 2023-04-15 DIAGNOSIS — M79604 Pain in right leg: Secondary | ICD-10-CM | POA: Diagnosis not present

## 2023-04-15 DIAGNOSIS — M25561 Pain in right knee: Secondary | ICD-10-CM | POA: Diagnosis not present

## 2023-04-15 DIAGNOSIS — G8929 Other chronic pain: Secondary | ICD-10-CM | POA: Diagnosis not present

## 2023-04-15 DIAGNOSIS — M545 Low back pain, unspecified: Secondary | ICD-10-CM | POA: Diagnosis not present

## 2023-04-15 DIAGNOSIS — M961 Postlaminectomy syndrome, not elsewhere classified: Secondary | ICD-10-CM | POA: Diagnosis not present

## 2023-04-15 DIAGNOSIS — T7849XA Other allergy, initial encounter: Secondary | ICD-10-CM | POA: Diagnosis not present

## 2023-04-15 DIAGNOSIS — M5137 Other intervertebral disc degeneration, lumbosacral region: Secondary | ICD-10-CM | POA: Diagnosis not present

## 2023-04-15 DIAGNOSIS — G894 Chronic pain syndrome: Secondary | ICD-10-CM | POA: Diagnosis not present

## 2023-04-15 DIAGNOSIS — Z79891 Long term (current) use of opiate analgesic: Secondary | ICD-10-CM | POA: Diagnosis not present

## 2023-05-13 DIAGNOSIS — G894 Chronic pain syndrome: Secondary | ICD-10-CM | POA: Diagnosis not present

## 2023-05-13 DIAGNOSIS — G8929 Other chronic pain: Secondary | ICD-10-CM | POA: Diagnosis not present

## 2023-05-13 DIAGNOSIS — Z79891 Long term (current) use of opiate analgesic: Secondary | ICD-10-CM | POA: Diagnosis not present

## 2023-05-13 DIAGNOSIS — T7849XA Other allergy, initial encounter: Secondary | ICD-10-CM | POA: Diagnosis not present

## 2023-05-13 DIAGNOSIS — M79604 Pain in right leg: Secondary | ICD-10-CM | POA: Diagnosis not present

## 2023-05-13 DIAGNOSIS — M545 Low back pain, unspecified: Secondary | ICD-10-CM | POA: Diagnosis not present

## 2023-05-13 DIAGNOSIS — M5137 Other intervertebral disc degeneration, lumbosacral region: Secondary | ICD-10-CM | POA: Diagnosis not present

## 2023-05-13 DIAGNOSIS — G43909 Migraine, unspecified, not intractable, without status migrainosus: Secondary | ICD-10-CM | POA: Diagnosis not present

## 2023-05-13 DIAGNOSIS — M961 Postlaminectomy syndrome, not elsewhere classified: Secondary | ICD-10-CM | POA: Diagnosis not present

## 2023-05-13 DIAGNOSIS — M25561 Pain in right knee: Secondary | ICD-10-CM | POA: Diagnosis not present

## 2023-06-10 DIAGNOSIS — G90523 Complex regional pain syndrome I of lower limb, bilateral: Secondary | ICD-10-CM | POA: Diagnosis not present

## 2023-06-10 DIAGNOSIS — M5137 Other intervertebral disc degeneration, lumbosacral region with discogenic back pain only: Secondary | ICD-10-CM | POA: Diagnosis not present

## 2023-06-10 DIAGNOSIS — M25561 Pain in right knee: Secondary | ICD-10-CM | POA: Diagnosis not present

## 2023-06-10 DIAGNOSIS — M961 Postlaminectomy syndrome, not elsewhere classified: Secondary | ICD-10-CM | POA: Diagnosis not present

## 2023-06-10 DIAGNOSIS — Z79891 Long term (current) use of opiate analgesic: Secondary | ICD-10-CM | POA: Diagnosis not present

## 2023-06-10 DIAGNOSIS — M79604 Pain in right leg: Secondary | ICD-10-CM | POA: Diagnosis not present

## 2023-06-10 DIAGNOSIS — G43909 Migraine, unspecified, not intractable, without status migrainosus: Secondary | ICD-10-CM | POA: Diagnosis not present

## 2023-06-10 DIAGNOSIS — G894 Chronic pain syndrome: Secondary | ICD-10-CM | POA: Diagnosis not present

## 2023-06-10 DIAGNOSIS — T7849XA Other allergy, initial encounter: Secondary | ICD-10-CM | POA: Diagnosis not present

## 2023-06-18 DIAGNOSIS — E538 Deficiency of other specified B group vitamins: Secondary | ICD-10-CM | POA: Diagnosis not present

## 2023-06-25 DIAGNOSIS — T7849XA Other allergy, initial encounter: Secondary | ICD-10-CM | POA: Diagnosis not present

## 2023-06-25 DIAGNOSIS — G894 Chronic pain syndrome: Secondary | ICD-10-CM | POA: Diagnosis not present

## 2023-06-25 DIAGNOSIS — G90523 Complex regional pain syndrome I of lower limb, bilateral: Secondary | ICD-10-CM | POA: Diagnosis not present

## 2023-06-25 DIAGNOSIS — E538 Deficiency of other specified B group vitamins: Secondary | ICD-10-CM | POA: Diagnosis not present

## 2023-06-25 DIAGNOSIS — M5137 Other intervertebral disc degeneration, lumbosacral region with discogenic back pain only: Secondary | ICD-10-CM | POA: Diagnosis not present

## 2023-06-25 DIAGNOSIS — M961 Postlaminectomy syndrome, not elsewhere classified: Secondary | ICD-10-CM | POA: Diagnosis not present

## 2023-06-25 DIAGNOSIS — Z79891 Long term (current) use of opiate analgesic: Secondary | ICD-10-CM | POA: Diagnosis not present

## 2023-07-21 ENCOUNTER — Ambulatory Visit: Payer: Medicare HMO | Admitting: Allergy and Immunology

## 2023-07-21 ENCOUNTER — Encounter: Payer: Self-pay | Admitting: Allergy and Immunology

## 2023-07-21 VITALS — BP 130/86 | HR 103 | Resp 16 | Ht 72.9 in | Wt 227.4 lb

## 2023-07-21 DIAGNOSIS — J3089 Other allergic rhinitis: Secondary | ICD-10-CM | POA: Diagnosis not present

## 2023-07-21 DIAGNOSIS — H04123 Dry eye syndrome of bilateral lacrimal glands: Secondary | ICD-10-CM | POA: Diagnosis not present

## 2023-07-21 DIAGNOSIS — J301 Allergic rhinitis due to pollen: Secondary | ICD-10-CM | POA: Diagnosis not present

## 2023-07-21 DIAGNOSIS — Z7952 Long term (current) use of systemic steroids: Secondary | ICD-10-CM

## 2023-07-21 DIAGNOSIS — L989 Disorder of the skin and subcutaneous tissue, unspecified: Secondary | ICD-10-CM

## 2023-07-21 MED ORDER — PIMECROLIMUS 1 % EX CREA: TOPICAL_CREAM | Freq: Two times a day (BID) | CUTANEOUS | 5 refills | Status: DC

## 2023-07-21 MED ORDER — MOMETASONE FUROATE 0.1 % EX OINT: TOPICAL_OINTMENT | Freq: Two times a day (BID) | CUTANEOUS | 5 refills | Status: AC

## 2023-07-21 NOTE — Patient Instructions (Addendum)
  1. Continue Flonase Sensimist + Montelukast 10 mg daily  2. Continue wetting solution for eyes  3. Continue nasal ipratropium + Epi-Pen if needed  4. Continue to avoid fish + shellfish & dust mite + dog + ragweed  5. Treat skin lesions with Elidel followed by Mometasone 0.1% Ointment 2 times per day  6. Obtain biopsy of skin lesion with [facility]  7. Blood - ANA w/r, Celiac screen w/ IgA, anti-skin ab., CBC w/d, CMP, sed, CRP  8. Return to clinic in 2 weeks or earlier if problem

## 2023-07-21 NOTE — Progress Notes (Unsigned)
[place] - [place]   Follow-up Note  Referring Provider: Street, Stephanie Coup, * Primary Provider: Street, Stephanie Coup, MD Date of Office Visit: 07/21/2023  Subjective:   Jose [name] (DOB: Nov 23, 1965) is a 57 y.o. male who [facility] in re-evaluation of the following:  HPI: Jose [name] returns to this clinic in evaluation of urticaria.  I have seen him in this clinic for allergic rhinoconjunctivitis, suspected dry eye syndrome, shellfish and fish allergy.  I last saw him in this clinic 14 November 2020.  He has really been doing quite well regarding his airway issue and his eye issue while consistently using a nasal steroid and montelukast as well as wetting solutions for his dry eye.    What is bothering him is a skin condition.  Apparently over the course of the past few years he has developed this red swollen blistering type of dermatitis affecting multiple areas of his body including his face and trunk that has required the administration of systemic steroids about 1 or 2 times per month.  When he gets systemic steroids he does appear to respond but then relapses.  He has been given some topical agents to use which have not really helped him very much.  His last administration of systemic steroids was last week.  He does not consume shellfish or fish.  Allergies as of 07/21/2023       Reactions   Betadine [povidone Iodine] Shortness Of Breath, Swelling, Rash, Other (See Comments)   Burning and blisters, when applied topically.     Shellfish Allergy Hives, Shortness Of Breath, Rash   Iodinated Contrast Media Other (See Comments)   Patient states an MD told him he is "allergic to iodine" after several incidences of reaction to topical betadine/iodine soaps and creams.  No known documentation of IV Contrast.  Patient had Diskogram 09/2001 with contrast into discs without any documented difficulty or prep.         Medication List    allopurinol 300 MG tablet Commonly known as: ZYLOPRIM Take 300 mg by mouth daily.   Alrex 0.2 % Susp Generic drug: loteprednol Use one drop in each eye once daily as directed.   atorvastatin 80 MG tablet Commonly known as: LIPITOR 40 mg daily.   buPROPion 150 MG 24 hr tablet Commonly known as: WELLBUTRIN XL Take 150 mg by mouth daily.   cycloSPORINE 100 MG capsule Commonly known as: SANDIMMUNE Take 100 mg by mouth daily.   Eliquis 5 MG Tabs tablet Generic drug: apixaban Take 5 mg by mouth 2 (two) times daily.   EPINEPHrine 0.3 mg/0.3 mL Soaj injection Commonly known as: EPI-PEN Use as directed for life-threatening allergic reaction.   famotidine 40 MG tablet Commonly known as: PEPCID Take 40 mg by mouth daily.   gabapentin 300 MG capsule Commonly known as: NEURONTIN 300 mg 3 (three) times daily.   ipratropium 0.06 % nasal spray Commonly known as: ATROVENT CAN USE TWO SPRAYS IN EACH NOSTRIL EVERY 6 HOURS IF NEEDED TO DRY UP NOSE.   losartan 50 MG tablet Commonly known as: COZAAR Take 1 tablet (50 mg total) by mouth daily.   metaxalone 800 MG tablet Commonly known as: SKELAXIN Take by mouth.   mometasone 0.1 % ointment Commonly known as: ELOCON Apply topically in the morning and at bedtime. Started by: Kalel Harty J Laverda Stribling   montelukast 10 MG tablet Commonly known as: SINGULAIR Take 1 tablet (  10 mg total) by mouth at bedtime.   nitroGLYCERIN 0.4 MG SL tablet Commonly known as: NITROSTAT Place 0.4 mg under the tongue every 5 (five) minutes as needed for chest pain.   Olopatadine HCl 0.2 % Soln Can use one drop in each eye once daily if needed for red, itchy, watery eyes.   oxyCODONE 15 MG immediate release tablet Commonly known as: ROXICODONE Take 15 mg by mouth every 6 (six) hours.   pantoprazole 40 MG tablet Commonly known as: PROTONIX Take 40 mg by mouth daily.   pimecrolimus 1 % cream Commonly known as: Elidel Apply  topically 2 (two) times daily. Started by: Hensley Treat J Dannia Snook   triazolam 0.25 MG tablet Commonly known as: HALCION 0.25 mg at bedtime.    Past Medical History:  Diagnosis Date   Allergic rhinitis    DVT (deep venous thrombosis) (HCC) 03/2015   right leg   Fibromyalgia    Gout    High blood pressure    High cholesterol    Migraine    Peripheral neuropathy 01/26/2017    Past Surgical History:  Procedure Laterality Date   ABDOMINAL EXPLORATION SURGERY     BACK SURGERY     x 5, [place], Dr Trey Sailors   CORONARY ANGIOPLASTY WITH STENT PLACEMENT  2011   LITHOTRIPSY  2017    Review of systems negative except as noted in HPI / PMHx or noted below:  Review of Systems  Constitutional: Negative.   HENT: Negative.    Eyes: Negative.   Respiratory: Negative.    Cardiovascular: Negative.   Gastrointestinal: Negative.   Genitourinary: Negative.   Musculoskeletal: Negative.   Skin: Negative.   Neurological: Negative.   Endo/Heme/Allergies: Negative.   Psychiatric/Behavioral: Negative.       Objective:   Vitals:   07/21/23 1130  BP: 130/86  Pulse: (!) 103  Resp: 16  SpO2: 99%   Height: 6' 0.9" (185.2 cm)  Weight: 227 lb 6.4 oz (103.1 kg)   Physical Exam Constitutional:      Appearance: He is not diaphoretic.  HENT:     Head: Normocephalic.     Right Ear: Tympanic membrane, ear canal and external ear normal.     Left Ear: Tympanic membrane, ear canal and external ear normal.     Nose: Nose normal. No mucosal edema or rhinorrhea.     Mouth/Throat:     Pharynx: Uvula midline. No oropharyngeal exudate.  Eyes:     Conjunctiva/sclera: Conjunctivae normal.  Neck:     Thyroid: No thyromegaly.     Trachea: Trachea normal. No tracheal tenderness or tracheal deviation.  Cardiovascular:     Rate and Rhythm: Normal rate and regular rhythm.     Heart sounds: Normal heart sounds, S1 normal and S2 normal. No murmur heard. Pulmonary:     Effort: No respiratory distress.      Breath sounds: Normal breath sounds. No stridor. No wheezing or rales.  Lymphadenopathy:     Head:     Right side of head: No tonsillar adenopathy.     Left side of head: No tonsillar adenopathy.     Cervical: No cervical adenopathy.  Skin:    Findings: Rash (multiple erythematous indurated 1 cm lesion with central vesicle trunk) present. No erythema.     Nails: There is no clubbing.  Neurological:     Mental Status: He is alert.     Diagnostics: none  Assessment and Plan:   1. Inflammatory dermatosis  2. Long term systemic steroid user   3. Perennial allergic rhinitis   4. Seasonal allergic rhinitis due to pollen   5. Dry eye syndrome of both eyes    1. Continue Flonase Sensimist + Montelukast 10 mg daily  2. Continue wetting solution for eyes  3. Continue nasal ipratropium + Epi-Pen if needed  4. Continue to avoid fish + shellfish & dust mite + dog + ragweed  5. Treat skin lesions with Elidel followed by Mometasone 0.1% Ointment 2 times per day  6. Obtain biopsy of skin lesion with [facility] D[facility]at[facility]  7. Blood - ANA w/r, Celiac screen w/ IgA, anti-skin ab., CBC w/d, CMP, sed, CRP  8. Return to clinic in 2 weeks or earlier if problem  Odus has some form of inflammatory dermatosis that is very active and is requiring the administration of systemic steroids on a common basis.  I am going to have him use a calcineurin inhibitor and topical steroid twice a day to these areas and we are going to further evaluate his issue with the blood test noted above including antiskin antibodie for pemphigus/pemphigoid and celiac screen and have a biopsy performed by dermatology.  He can continue to use therapy directed against his airway issue and I issue as noted above.  I will see him back in this clinic in 2 weeks.  Laurette Schimke, MD Allergy / Immunology [facility]

## 2023-07-22 ENCOUNTER — Telehealth: Payer: Self-pay | Admitting: *Deleted

## 2023-07-22 NOTE — Telephone Encounter (Signed)
Elidel and alternatives are too expensive for [place] [facility]. He wants to know if he can just use the Mometasone and see if it works.

## 2023-07-23 ENCOUNTER — Other Ambulatory Visit: Payer: Self-pay | Admitting: *Deleted

## 2023-07-23 DIAGNOSIS — Z79891 Long term (current) use of opiate analgesic: Secondary | ICD-10-CM | POA: Diagnosis not present

## 2023-07-23 DIAGNOSIS — G90523 Complex regional pain syndrome I of lower limb, bilateral: Secondary | ICD-10-CM | POA: Diagnosis not present

## 2023-07-23 DIAGNOSIS — M5137 Other intervertebral disc degeneration, lumbosacral region with discogenic back pain only: Secondary | ICD-10-CM | POA: Diagnosis not present

## 2023-07-23 DIAGNOSIS — M961 Postlaminectomy syndrome, not elsewhere classified: Secondary | ICD-10-CM | POA: Diagnosis not present

## 2023-07-23 DIAGNOSIS — G894 Chronic pain syndrome: Secondary | ICD-10-CM | POA: Diagnosis not present

## 2023-07-23 DIAGNOSIS — T7849XA Other allergy, initial encounter: Secondary | ICD-10-CM | POA: Diagnosis not present

## 2023-07-23 MED ORDER — TACROLIMUS 0.1 % EX CREA: TOPICAL_CREAM | CUTANEOUS | 5 refills | Status: AC

## 2023-07-23 NOTE — Telephone Encounter (Signed)
RX for Tacrolimus has been sent to check cost.

## 2023-07-25 LAB — CBC WITH DIFF/PLATELET
Basophils Absolute: 0 10*3/uL (ref 0.0–0.2)
Basos: 1 %
EOS (ABSOLUTE): 0.1 10*3/uL (ref 0.0–0.4)
Eos: 1 %
Hematocrit: 40.9 % (ref 37.5–51.0)
Hemoglobin: 13.7 g/dL (ref 13.0–17.7)
Immature Grans (Abs): 0 10*3/uL (ref 0.0–0.1)
Immature Granulocytes: 1 %
Lymphocytes Absolute: 1.5 10*3/uL (ref 0.7–3.1)
Lymphs: 25 %
MCH: 28.8 pg (ref 26.6–33.0)
MCHC: 33.5 g/dL (ref 31.5–35.7)
MCV: 86 fL (ref 79–97)
Monocytes Absolute: 0.6 10*3/uL (ref 0.1–0.9)
Monocytes: 10 %
Neutrophils Absolute: 3.8 10*3/uL (ref 1.4–7.0)
Neutrophils: 62 %
Platelets: 201 10*3/uL (ref 150–450)
RBC: 4.76 x10E6/uL (ref 4.14–5.80)
RDW: 13 % (ref 11.6–15.4)
WBC: 6.1 10*3/uL (ref 3.4–10.8)

## 2023-07-25 LAB — SEDIMENTATION RATE: Sed Rate: 3 mm/h (ref 0–30)

## 2023-07-25 LAB — CELIAC PANEL 10
Antigliadin Abs, IgA: 2 U (ref 0–19)
Endomysial IgA: NEGATIVE
Gliadin IgG: 2 U (ref 0–19)
IgA/Immunoglobulin A, Serum: 114 mg/dL (ref 90–386)
Tissue Transglut Ab: 3 U/mL (ref 0–5)

## 2023-07-25 LAB — COMPREHENSIVE METABOLIC PANEL
ALT: 27 [IU]/L (ref 0–44)
AST: 19 [IU]/L (ref 0–40)
Albumin: 4.5 g/dL (ref 3.8–4.9)
Alkaline Phosphatase: 85 [IU]/L (ref 44–121)
BUN/Creatinine Ratio: 9 (ref 9–20)
BUN: 12 mg/dL (ref 6–24)
Bilirubin Total: 0.7 mg/dL (ref 0.0–1.2)
CO2: 22 mmol/L (ref 20–29)
Calcium: 9.5 mg/dL (ref 8.7–10.2)
Chloride: 106 mmol/L (ref 96–106)
Creatinine, Ser: 1.29 mg/dL — ABNORMAL HIGH (ref 0.76–1.27)
Globulin, Total: 2 g/dL (ref 1.5–4.5)
Glucose: 95 mg/dL (ref 70–99)
Potassium: 4.4 mmol/L (ref 3.5–5.2)
Sodium: 144 mmol/L (ref 134–144)
Total Protein: 6.5 g/dL (ref 6.0–8.5)
eGFR: 64 mL/min/{1.73_m2} (ref >59)

## 2023-07-25 LAB — C-REACTIVE PROTEIN: CRP: <1 mg/L (ref 0–10)

## 2023-07-25 LAB — ANA W/REFLEX: Anti Nuclear Antibody (ANA): NEGATIVE

## 2023-07-25 LAB — ANTISKIN AUTOANTIBODIES, QUANT: Pemphigus: NEGATIVE

## 2023-08-05 ENCOUNTER — Ambulatory Visit: Payer: Medicare HMO | Admitting: Allergy and Immunology

## 2023-08-05 ENCOUNTER — Encounter: Payer: Self-pay | Admitting: Allergy and Immunology

## 2023-08-05 VITALS — BP 126/82 | HR 102 | Resp 16

## 2023-08-05 DIAGNOSIS — J3089 Other allergic rhinitis: Secondary | ICD-10-CM

## 2023-08-05 DIAGNOSIS — J301 Allergic rhinitis due to pollen: Secondary | ICD-10-CM

## 2023-08-05 DIAGNOSIS — L989 Disorder of the skin and subcutaneous tissue, unspecified: Secondary | ICD-10-CM | POA: Diagnosis not present

## 2023-08-05 DIAGNOSIS — H04123 Dry eye syndrome of bilateral lacrimal glands: Secondary | ICD-10-CM | POA: Diagnosis not present

## 2023-08-05 NOTE — Progress Notes (Signed)
[place] - [place] - [place] - [place]   Follow-up Note  Referring Provider: Street, Stephanie Coup, * Primary Provider: Street, Stephanie Coup, MD Date of Office Visit: 08/05/2023  Subjective:   Jose [name] (DOB: 30-Aug-1965) is a 57 y.o. male who [facility] 08[facility]/2023 in re-evaluation of the following:  HPI: Jose [name] returns to this clinic in reevaluation of urticaria/inflammatory dermatosis, allergic rhinoconjunctivitis, shellfish and fish allergy.  I last saw him in this clinic for December 2024.  When he was last seen in this clinic we were addressing his skin condition which was present for a few years and it appeared to be a blistering type of inflammatory dermatosis and we gave him a topical calcineurin inhibitor and a topical steroid to use.  We also arranged for him to see [facility] dermatology but for some reason that appointment never occurred.  He thinks that he might be somewhat better but he still has new lesions that come up.  Maybe they heal a little bit faster with this topical agents.  Has had no airway issues.  He does not eat fish or shellfish.  Allergies as of 08/05/2023       Reactions   Betadine [povidone Iodine] Shortness Of Breath, Swelling, Rash, Other (See Comments)   Burning and blisters, when applied topically.     Shellfish Allergy Hives, Shortness Of Breath, Rash   Iodinated Contrast Media Other (See Comments)   Patient states an MD told him he is "allergic to iodine" after several incidences of reaction to topical betadine/iodine soaps and creams.  No known documentation of IV Contrast.  Patient had Diskogram 09/2001 with contrast into discs without any documented difficulty or prep.        Medication List    allopurinol 300 MG tablet Commonly known as: ZYLOPRIM Take 300 mg by mouth daily.   Alrex 0.2 % Susp Generic drug: loteprednol Use one drop in each eye once daily as directed.    atorvastatin 80 MG tablet Commonly known as: LIPITOR 40 mg daily.   buPROPion 150 MG 24 hr tablet Commonly known as: WELLBUTRIN XL Take 150 mg by mouth daily.   cycloSPORINE 100 MG capsule Commonly known as: SANDIMMUNE Take 100 mg by mouth daily.   Eliquis 5 MG Tabs tablet Generic drug: apixaban Take 5 mg by mouth 2 (two) times daily.   EPINEPHrine 0.3 mg/0.3 mL Soaj injection Commonly known as: EPI-PEN Use as directed for life-threatening allergic reaction.   famotidine 40 MG tablet Commonly known as: PEPCID Take 40 mg by mouth daily.   gabapentin 300 MG capsule Commonly known as: NEURONTIN 300 mg 3 (three) times daily.   ipratropium 0.06 % nasal spray Commonly known as: ATROVENT CAN USE TWO SPRAYS IN EACH NOSTRIL EVERY 6 HOURS IF NEEDED TO DRY UP NOSE.   losartan 50 MG tablet Commonly known as: COZAAR Take 1 tablet (50 mg total) by mouth daily.   metaxalone 800 MG tablet Commonly known as: SKELAXIN Take by mouth.   mometasone 0.1 % ointment Commonly known as: ELOCON Apply topically in the morning and at bedtime.   montelukast 10 MG tablet Commonly known as: SINGULAIR Take 1 tablet (10 mg total) by mouth at bedtime.   nitroGLYCERIN 0.4 MG SL tablet Commonly known as: NITROSTAT Place 0.4 mg under the tongue every 5 (five) minutes as needed for chest pain.   Olopatadine HCl 0.2 % Soln Can use one drop in each eye once  daily if needed for red, itchy, watery eyes.   pantoprazole 40 MG tablet Commonly known as: PROTONIX Take 40 mg by mouth daily.   Tacrolimus 0.1 % Crea Apply to affected areas twice daily   triazolam 0.25 MG tablet Commonly known as: HALCION 0.25 mg at bedtime.    Past Medical History:  Diagnosis Date   Allergic rhinitis    DVT (deep venous thrombosis) (HCC) 03/2015   right leg   Fibromyalgia    Gout    High blood pressure    High cholesterol    Migraine    Peripheral neuropathy 01/26/2017    Past Surgical History:   Procedure Laterality Date   ABDOMINAL EXPLORATION SURGERY     BACK SURGERY     x 5, , Dr Trey Sailors   CORONARY ANGIOPLASTY WITH STENT PLACEMENT  2011   LITHOTRIPSY  2017    Review of systems negative except as noted in HPI / PMHx or noted below:  Review of Systems  Constitutional: Negative.   HENT: Negative.    Eyes: Negative.   Respiratory: Negative.    Cardiovascular: Negative.   Gastrointestinal: Negative.   Genitourinary: Negative.   Musculoskeletal: Negative.   Skin: Negative.   Neurological: Negative.   Endo/Heme/Allergies: Negative.   Psychiatric/Behavioral: Negative.       Objective:   Vitals:   08/05/23 1627  BP: 126/82  Pulse: (!) 102  Resp: 16  SpO2: 98%          Physical Exam Constitutional:      Appearance: He is not diaphoretic.  HENT:     Head: Normocephalic.     Right Ear: Tympanic membrane, ear canal and external ear normal.     Left Ear: Tympanic membrane, ear canal and external ear normal.     Nose: Nose normal. No mucosal edema or rhinorrhea.     Mouth/Throat:     Pharynx: Uvula midline. No oropharyngeal exudate.  Eyes:     Conjunctiva/sclera: Conjunctivae normal.  Neck:     Thyroid: No thyromegaly.     Trachea: Trachea normal. No tracheal tenderness or tracheal deviation.  Cardiovascular:     Rate and Rhythm: Normal rate and regular rhythm.     Heart sounds: Normal heart sounds, S1 normal and S2 normal. No murmur heard. Pulmonary:     Effort: No respiratory distress.     Breath sounds: Normal breath sounds. No stridor. No wheezing or rales.  Lymphadenopathy:     Head:     Right side of head: No tonsillar adenopathy.     Left side of head: No tonsillar adenopathy.     Cervical: No cervical adenopathy.  Skin:    Findings: Rash (Multiple erythematous indurated lesions with central vesicle anterior chest.) present. No erythema.     Nails: There is no clubbing.  Neurological:     Mental Status: He is alert.      Diagnostics:   Results of blood tests obtained 21 July 2023 identifies creatinine 1.29 Mg/DL, AST 40J/W, ALT 11B/J, WBC 6.1, absolute eosinophil 100, absolute basophil 100, absolute lymphocyte 1500, hemoglobin 13.1, platelet 201, negative transglutaminase IgA antibody, negative ANA, negative pemphigus antibody.  Assessment and Plan:   1. Inflammatory dermatosis   2. Perennial allergic rhinitis   3. Seasonal allergic rhinitis due to pollen   4. Dry eye syndrome of both eyes     1. Continue Flonase Sensimist + Montelukast 10 mg daily  2. Continue wetting solution for eyes  3. Continue nasal  ipratropium + Epi-Pen if needed  4. Continue to avoid fish + shellfish & dust mite + dog + ragweed  5. Treat skin lesions with Tacrolimus 0.1% followed by Mometasone 0.1% Ointment 2 times per day  6. Obtain biopsy of skin lesion with [facility] - 08 September 2023  7. Letter to Pain center to allow restart of opiates  8. Further evaluation and treatment???  Jiyaan has had some response to his topical agents and will keep him on a calcineurin inhibitor and topical steroid.  I would like [place] dermatology to perform a biopsy of the skin lesions.  Fortunately, there does not appear to be any antibodies associated with a blistering dermatitis based on his blood tests but still would like for him to get a biopsy.  His pain center does not want to have him restart opiates.  He discontinued his opiates for approximately 6 weeks and this did not make any difference with his skin lesions and he would like to restart his opiates and we will write a letter to his pain center concerning that issue.  Laurette Schimke, MD Allergy / Immunology [facility]

## 2023-08-05 NOTE — Patient Instructions (Addendum)
  1. Continue Flonase Sensimist + Montelukast 10 mg daily  2. Continue wetting solution for eyes  3. Continue nasal ipratropium + Epi-Pen if needed  4. Continue to avoid fish + shellfish & dust mite + dog + ragweed  5. Treat skin lesions with Tacrolimus 0.1% followed by Mometasone 0.1% Ointment 2 times per day  6. Obtain biopsy of skin lesion with [facility]atology - 08 September 2023  7. Letter to Pain center to allow restart of opiates  8. Further evaluation and treatment???

## 2023-08-09 ENCOUNTER — Encounter: Payer: Self-pay | Admitting: Allergy and Immunology

## 2023-08-09 DIAGNOSIS — T8090XA Unspecified complication following infusion and therapeutic injection, initial encounter: Secondary | ICD-10-CM | POA: Diagnosis not present

## 2023-08-09 DIAGNOSIS — Z6830 Body mass index (BMI) 30.0-30.9, adult: Secondary | ICD-10-CM | POA: Diagnosis not present

## 2023-08-19 DIAGNOSIS — Z79891 Long term (current) use of opiate analgesic: Secondary | ICD-10-CM | POA: Diagnosis not present

## 2023-08-19 DIAGNOSIS — G43909 Migraine, unspecified, not intractable, without status migrainosus: Secondary | ICD-10-CM | POA: Diagnosis not present

## 2023-08-19 DIAGNOSIS — T7849XA Other allergy, initial encounter: Secondary | ICD-10-CM | POA: Diagnosis not present

## 2023-08-19 DIAGNOSIS — G90523 Complex regional pain syndrome I of lower limb, bilateral: Secondary | ICD-10-CM | POA: Diagnosis not present

## 2023-08-19 DIAGNOSIS — M5137 Other intervertebral disc degeneration, lumbosacral region with discogenic back pain only: Secondary | ICD-10-CM | POA: Diagnosis not present

## 2023-08-19 DIAGNOSIS — G894 Chronic pain syndrome: Secondary | ICD-10-CM | POA: Diagnosis not present

## 2023-09-16 DIAGNOSIS — M545 Low back pain, unspecified: Secondary | ICD-10-CM | POA: Diagnosis not present

## 2023-09-16 DIAGNOSIS — M961 Postlaminectomy syndrome, not elsewhere classified: Secondary | ICD-10-CM | POA: Diagnosis not present

## 2023-09-16 DIAGNOSIS — Z79891 Long term (current) use of opiate analgesic: Secondary | ICD-10-CM | POA: Diagnosis not present

## 2023-09-16 DIAGNOSIS — M79604 Pain in right leg: Secondary | ICD-10-CM | POA: Diagnosis not present

## 2023-09-16 DIAGNOSIS — G894 Chronic pain syndrome: Secondary | ICD-10-CM | POA: Diagnosis not present

## 2023-09-30 DIAGNOSIS — L739 Follicular disorder, unspecified: Secondary | ICD-10-CM | POA: Diagnosis not present

## 2023-10-15 DIAGNOSIS — M545 Low back pain, unspecified: Secondary | ICD-10-CM | POA: Diagnosis not present

## 2023-10-15 DIAGNOSIS — G894 Chronic pain syndrome: Secondary | ICD-10-CM | POA: Diagnosis not present

## 2023-10-15 DIAGNOSIS — M961 Postlaminectomy syndrome, not elsewhere classified: Secondary | ICD-10-CM | POA: Diagnosis not present

## 2023-10-15 DIAGNOSIS — Z79891 Long term (current) use of opiate analgesic: Secondary | ICD-10-CM | POA: Diagnosis not present

## 2023-10-15 DIAGNOSIS — M79604 Pain in right leg: Secondary | ICD-10-CM | POA: Diagnosis not present

## 2023-10-18 DIAGNOSIS — R509 Fever, unspecified: Secondary | ICD-10-CM | POA: Diagnosis not present

## 2023-10-18 DIAGNOSIS — R0981 Nasal congestion: Secondary | ICD-10-CM | POA: Diagnosis not present

## 2023-10-18 DIAGNOSIS — R051 Acute cough: Secondary | ICD-10-CM | POA: Diagnosis not present

## 2023-11-10 DIAGNOSIS — Z6830 Body mass index (BMI) 30.0-30.9, adult: Secondary | ICD-10-CM | POA: Diagnosis not present

## 2023-11-10 DIAGNOSIS — M25512 Pain in left shoulder: Secondary | ICD-10-CM | POA: Diagnosis not present

## 2023-11-11 DIAGNOSIS — L739 Follicular disorder, unspecified: Secondary | ICD-10-CM | POA: Diagnosis not present

## 2023-11-12 DIAGNOSIS — M79604 Pain in right leg: Secondary | ICD-10-CM | POA: Diagnosis not present

## 2023-11-12 DIAGNOSIS — M545 Low back pain, unspecified: Secondary | ICD-10-CM | POA: Diagnosis not present

## 2023-11-12 DIAGNOSIS — G894 Chronic pain syndrome: Secondary | ICD-10-CM | POA: Diagnosis not present

## 2023-11-12 DIAGNOSIS — Z79891 Long term (current) use of opiate analgesic: Secondary | ICD-10-CM | POA: Diagnosis not present

## 2023-11-12 DIAGNOSIS — M25561 Pain in right knee: Secondary | ICD-10-CM | POA: Diagnosis not present

## 2023-12-09 DIAGNOSIS — G894 Chronic pain syndrome: Secondary | ICD-10-CM | POA: Diagnosis not present

## 2023-12-09 DIAGNOSIS — G8929 Other chronic pain: Secondary | ICD-10-CM | POA: Diagnosis not present

## 2023-12-09 DIAGNOSIS — M25561 Pain in right knee: Secondary | ICD-10-CM | POA: Diagnosis not present

## 2023-12-09 DIAGNOSIS — M79604 Pain in right leg: Secondary | ICD-10-CM | POA: Diagnosis not present

## 2023-12-09 DIAGNOSIS — Z79891 Long term (current) use of opiate analgesic: Secondary | ICD-10-CM | POA: Diagnosis not present

## 2023-12-16 DIAGNOSIS — S30861A Insect bite (nonvenomous) of abdominal wall, initial encounter: Secondary | ICD-10-CM | POA: Diagnosis not present

## 2024-01-06 DIAGNOSIS — M5451 Vertebrogenic low back pain: Secondary | ICD-10-CM | POA: Diagnosis not present

## 2024-01-06 DIAGNOSIS — M25561 Pain in right knee: Secondary | ICD-10-CM | POA: Diagnosis not present

## 2024-01-06 DIAGNOSIS — M961 Postlaminectomy syndrome, not elsewhere classified: Secondary | ICD-10-CM | POA: Diagnosis not present

## 2024-01-06 DIAGNOSIS — G90523 Complex regional pain syndrome I of lower limb, bilateral: Secondary | ICD-10-CM | POA: Diagnosis not present

## 2024-01-06 DIAGNOSIS — G894 Chronic pain syndrome: Secondary | ICD-10-CM | POA: Diagnosis not present

## 2024-01-06 DIAGNOSIS — Z79891 Long term (current) use of opiate analgesic: Secondary | ICD-10-CM | POA: Diagnosis not present

## 2024-02-03 DIAGNOSIS — G894 Chronic pain syndrome: Secondary | ICD-10-CM | POA: Diagnosis not present

## 2024-02-03 DIAGNOSIS — Z79891 Long term (current) use of opiate analgesic: Secondary | ICD-10-CM | POA: Diagnosis not present

## 2024-02-22 DIAGNOSIS — Z6829 Body mass index (BMI) 29.0-29.9, adult: Secondary | ICD-10-CM | POA: Diagnosis not present

## 2024-02-22 DIAGNOSIS — M961 Postlaminectomy syndrome, not elsewhere classified: Secondary | ICD-10-CM | POA: Diagnosis not present

## 2024-02-22 DIAGNOSIS — F4389 Other reactions to severe stress: Secondary | ICD-10-CM | POA: Diagnosis not present

## 2024-02-22 DIAGNOSIS — F411 Generalized anxiety disorder: Secondary | ICD-10-CM | POA: Diagnosis not present

## 2024-02-22 DIAGNOSIS — E669 Obesity, unspecified: Secondary | ICD-10-CM | POA: Diagnosis not present

## 2024-02-22 DIAGNOSIS — F1120 Opioid dependence, uncomplicated: Secondary | ICD-10-CM | POA: Diagnosis not present

## 2024-02-22 DIAGNOSIS — G4700 Insomnia, unspecified: Secondary | ICD-10-CM | POA: Diagnosis not present

## 2024-02-22 DIAGNOSIS — G63 Polyneuropathy in diseases classified elsewhere: Secondary | ICD-10-CM | POA: Diagnosis not present

## 2024-03-01 DIAGNOSIS — M5432 Sciatica, left side: Secondary | ICD-10-CM | POA: Diagnosis not present

## 2024-03-01 DIAGNOSIS — G894 Chronic pain syndrome: Secondary | ICD-10-CM | POA: Diagnosis not present

## 2024-03-01 DIAGNOSIS — M5116 Intervertebral disc disorders with radiculopathy, lumbar region: Secondary | ICD-10-CM | POA: Diagnosis not present

## 2024-03-01 DIAGNOSIS — M5431 Sciatica, right side: Secondary | ICD-10-CM | POA: Diagnosis not present

## 2024-03-01 DIAGNOSIS — M4316 Spondylolisthesis, lumbar region: Secondary | ICD-10-CM | POA: Diagnosis not present

## 2024-03-01 DIAGNOSIS — M4326 Fusion of spine, lumbar region: Secondary | ICD-10-CM | POA: Diagnosis not present

## 2024-03-09 DIAGNOSIS — T7849XA Other allergy, initial encounter: Secondary | ICD-10-CM | POA: Diagnosis not present

## 2024-03-09 DIAGNOSIS — Z79891 Long term (current) use of opiate analgesic: Secondary | ICD-10-CM | POA: Diagnosis not present

## 2024-03-09 DIAGNOSIS — G894 Chronic pain syndrome: Secondary | ICD-10-CM | POA: Diagnosis not present

## 2024-03-09 DIAGNOSIS — M5137 Other intervertebral disc degeneration, lumbosacral region with discogenic back pain only: Secondary | ICD-10-CM | POA: Diagnosis not present

## 2024-03-09 DIAGNOSIS — G90523 Complex regional pain syndrome I of lower limb, bilateral: Secondary | ICD-10-CM | POA: Diagnosis not present

## 2024-03-09 DIAGNOSIS — M961 Postlaminectomy syndrome, not elsewhere classified: Secondary | ICD-10-CM | POA: Diagnosis not present

## 2024-03-17 DIAGNOSIS — M4316 Spondylolisthesis, lumbar region: Secondary | ICD-10-CM | POA: Diagnosis not present

## 2024-03-17 DIAGNOSIS — G894 Chronic pain syndrome: Secondary | ICD-10-CM | POA: Diagnosis not present

## 2024-03-17 DIAGNOSIS — M47816 Spondylosis without myelopathy or radiculopathy, lumbar region: Secondary | ICD-10-CM | POA: Diagnosis not present

## 2024-03-17 DIAGNOSIS — M4326 Fusion of spine, lumbar region: Secondary | ICD-10-CM | POA: Diagnosis not present

## 2024-03-17 DIAGNOSIS — M5431 Sciatica, right side: Secondary | ICD-10-CM | POA: Diagnosis not present

## 2024-03-18 DIAGNOSIS — M4726 Other spondylosis with radiculopathy, lumbar region: Secondary | ICD-10-CM | POA: Diagnosis not present

## 2024-04-07 DIAGNOSIS — M79605 Pain in left leg: Secondary | ICD-10-CM | POA: Diagnosis not present

## 2024-04-07 DIAGNOSIS — M5116 Intervertebral disc disorders with radiculopathy, lumbar region: Secondary | ICD-10-CM | POA: Diagnosis not present

## 2024-04-07 DIAGNOSIS — G894 Chronic pain syndrome: Secondary | ICD-10-CM | POA: Diagnosis not present

## 2024-04-07 DIAGNOSIS — M4316 Spondylolisthesis, lumbar region: Secondary | ICD-10-CM | POA: Diagnosis not present

## 2024-04-07 DIAGNOSIS — M2560 Stiffness of unspecified joint, not elsewhere classified: Secondary | ICD-10-CM | POA: Diagnosis not present

## 2024-04-07 DIAGNOSIS — M79604 Pain in right leg: Secondary | ICD-10-CM | POA: Diagnosis not present

## 2024-04-07 DIAGNOSIS — R202 Paresthesia of skin: Secondary | ICD-10-CM | POA: Diagnosis not present

## 2024-04-07 DIAGNOSIS — M4326 Fusion of spine, lumbar region: Secondary | ICD-10-CM | POA: Diagnosis not present

## 2024-04-11 DIAGNOSIS — R051 Acute cough: Secondary | ICD-10-CM | POA: Diagnosis not present

## 2024-04-11 DIAGNOSIS — R0981 Nasal congestion: Secondary | ICD-10-CM | POA: Diagnosis not present

## 2024-04-13 DIAGNOSIS — M79605 Pain in left leg: Secondary | ICD-10-CM | POA: Diagnosis not present

## 2024-04-13 DIAGNOSIS — M2560 Stiffness of unspecified joint, not elsewhere classified: Secondary | ICD-10-CM | POA: Diagnosis not present

## 2024-04-13 DIAGNOSIS — M79604 Pain in right leg: Secondary | ICD-10-CM | POA: Diagnosis not present

## 2024-04-13 DIAGNOSIS — G894 Chronic pain syndrome: Secondary | ICD-10-CM | POA: Diagnosis not present

## 2024-04-13 DIAGNOSIS — R202 Paresthesia of skin: Secondary | ICD-10-CM | POA: Diagnosis not present

## 2024-04-13 DIAGNOSIS — M4326 Fusion of spine, lumbar region: Secondary | ICD-10-CM | POA: Diagnosis not present

## 2024-04-13 DIAGNOSIS — M4316 Spondylolisthesis, lumbar region: Secondary | ICD-10-CM | POA: Diagnosis not present

## 2024-04-13 DIAGNOSIS — M5116 Intervertebral disc disorders with radiculopathy, lumbar region: Secondary | ICD-10-CM | POA: Diagnosis not present

## 2024-04-14 DIAGNOSIS — M48062 Spinal stenosis, lumbar region with neurogenic claudication: Secondary | ICD-10-CM | POA: Diagnosis not present

## 2024-04-14 DIAGNOSIS — Z1330 Encounter for screening examination for mental health and behavioral disorders, unspecified: Secondary | ICD-10-CM | POA: Diagnosis not present

## 2024-04-20 DIAGNOSIS — M545 Low back pain, unspecified: Secondary | ICD-10-CM | POA: Diagnosis not present

## 2024-04-20 DIAGNOSIS — M961 Postlaminectomy syndrome, not elsewhere classified: Secondary | ICD-10-CM | POA: Diagnosis not present

## 2024-04-20 DIAGNOSIS — M5137 Other intervertebral disc degeneration, lumbosacral region with discogenic back pain only: Secondary | ICD-10-CM | POA: Diagnosis not present

## 2024-04-25 DIAGNOSIS — M4316 Spondylolisthesis, lumbar region: Secondary | ICD-10-CM | POA: Diagnosis not present

## 2024-04-25 DIAGNOSIS — M2560 Stiffness of unspecified joint, not elsewhere classified: Secondary | ICD-10-CM | POA: Diagnosis not present

## 2024-04-25 DIAGNOSIS — M79605 Pain in left leg: Secondary | ICD-10-CM | POA: Diagnosis not present

## 2024-04-25 DIAGNOSIS — M79604 Pain in right leg: Secondary | ICD-10-CM | POA: Diagnosis not present

## 2024-04-25 DIAGNOSIS — M5116 Intervertebral disc disorders with radiculopathy, lumbar region: Secondary | ICD-10-CM | POA: Diagnosis not present

## 2024-04-25 DIAGNOSIS — M6281 Muscle weakness (generalized): Secondary | ICD-10-CM | POA: Diagnosis not present

## 2024-04-25 DIAGNOSIS — M4326 Fusion of spine, lumbar region: Secondary | ICD-10-CM | POA: Diagnosis not present

## 2024-04-25 DIAGNOSIS — G894 Chronic pain syndrome: Secondary | ICD-10-CM | POA: Diagnosis not present

## 2024-04-25 DIAGNOSIS — R202 Paresthesia of skin: Secondary | ICD-10-CM | POA: Diagnosis not present

## 2024-04-27 DIAGNOSIS — M4326 Fusion of spine, lumbar region: Secondary | ICD-10-CM | POA: Diagnosis not present

## 2024-04-27 DIAGNOSIS — M5416 Radiculopathy, lumbar region: Secondary | ICD-10-CM | POA: Diagnosis not present

## 2024-05-15 DIAGNOSIS — M5416 Radiculopathy, lumbar region: Secondary | ICD-10-CM | POA: Diagnosis not present

## 2024-05-18 DIAGNOSIS — M961 Postlaminectomy syndrome, not elsewhere classified: Secondary | ICD-10-CM | POA: Diagnosis not present

## 2024-05-18 DIAGNOSIS — E7800 Pure hypercholesterolemia, unspecified: Secondary | ICD-10-CM | POA: Diagnosis not present

## 2024-05-18 DIAGNOSIS — G894 Chronic pain syndrome: Secondary | ICD-10-CM | POA: Diagnosis not present

## 2024-05-18 DIAGNOSIS — M545 Low back pain, unspecified: Secondary | ICD-10-CM | POA: Diagnosis not present

## 2024-05-18 DIAGNOSIS — Z79891 Long term (current) use of opiate analgesic: Secondary | ICD-10-CM | POA: Diagnosis not present

## 2024-06-05 DIAGNOSIS — M5416 Radiculopathy, lumbar region: Secondary | ICD-10-CM | POA: Diagnosis not present

## 2024-06-15 DIAGNOSIS — G894 Chronic pain syndrome: Secondary | ICD-10-CM | POA: Diagnosis not present

## 2024-06-15 DIAGNOSIS — M961 Postlaminectomy syndrome, not elsewhere classified: Secondary | ICD-10-CM | POA: Diagnosis not present

## 2024-06-15 DIAGNOSIS — G90523 Complex regional pain syndrome I of lower limb, bilateral: Secondary | ICD-10-CM | POA: Diagnosis not present

## 2024-06-15 DIAGNOSIS — Z79891 Long term (current) use of opiate analgesic: Secondary | ICD-10-CM | POA: Diagnosis not present

## 2024-06-15 DIAGNOSIS — M5137 Other intervertebral disc degeneration, lumbosacral region with discogenic back pain only: Secondary | ICD-10-CM | POA: Diagnosis not present

## 2024-06-15 DIAGNOSIS — L700 Acne vulgaris: Secondary | ICD-10-CM | POA: Diagnosis not present

## 2024-06-21 DIAGNOSIS — M5416 Radiculopathy, lumbar region: Secondary | ICD-10-CM | POA: Diagnosis not present

## 2024-06-30 DIAGNOSIS — M79661 Pain in right lower leg: Secondary | ICD-10-CM | POA: Diagnosis not present

## 2024-07-03 DIAGNOSIS — M4326 Fusion of spine, lumbar region: Secondary | ICD-10-CM | POA: Diagnosis not present

## 2024-07-03 DIAGNOSIS — M5416 Radiculopathy, lumbar region: Secondary | ICD-10-CM | POA: Diagnosis not present

## 2024-07-10 DIAGNOSIS — G894 Chronic pain syndrome: Secondary | ICD-10-CM | POA: Diagnosis not present

## 2024-07-10 DIAGNOSIS — M5137 Other intervertebral disc degeneration, lumbosacral region with discogenic back pain only: Secondary | ICD-10-CM | POA: Diagnosis not present

## 2024-07-10 DIAGNOSIS — G43909 Migraine, unspecified, not intractable, without status migrainosus: Secondary | ICD-10-CM | POA: Diagnosis not present

## 2024-07-10 DIAGNOSIS — E7800 Pure hypercholesterolemia, unspecified: Secondary | ICD-10-CM | POA: Diagnosis not present

## 2024-07-10 DIAGNOSIS — Z79891 Long term (current) use of opiate analgesic: Secondary | ICD-10-CM | POA: Diagnosis not present

## 2024-07-11 DIAGNOSIS — L245 Irritant contact dermatitis due to other chemical products: Secondary | ICD-10-CM | POA: Diagnosis not present

## 2024-07-12 DIAGNOSIS — I82431 Acute embolism and thrombosis of right popliteal vein: Secondary | ICD-10-CM | POA: Diagnosis not present

## 2024-07-12 DIAGNOSIS — I82411 Acute embolism and thrombosis of right femoral vein: Secondary | ICD-10-CM | POA: Diagnosis not present

## 2024-07-12 DIAGNOSIS — M79661 Pain in right lower leg: Secondary | ICD-10-CM | POA: Diagnosis not present

## 2024-07-27 DIAGNOSIS — L299 Pruritus, unspecified: Secondary | ICD-10-CM | POA: Diagnosis not present

## 2024-07-27 DIAGNOSIS — L710 Perioral dermatitis: Secondary | ICD-10-CM | POA: Diagnosis not present

## 2024-07-28 DIAGNOSIS — M5431 Sciatica, right side: Secondary | ICD-10-CM | POA: Diagnosis not present

## 2024-07-28 DIAGNOSIS — M961 Postlaminectomy syndrome, not elsewhere classified: Secondary | ICD-10-CM | POA: Diagnosis not present

## 2024-07-28 DIAGNOSIS — M5416 Radiculopathy, lumbar region: Secondary | ICD-10-CM | POA: Diagnosis not present

## 2024-07-28 DIAGNOSIS — M5432 Sciatica, left side: Secondary | ICD-10-CM | POA: Diagnosis not present

## 2024-07-28 DIAGNOSIS — M48062 Spinal stenosis, lumbar region with neurogenic claudication: Secondary | ICD-10-CM | POA: Diagnosis not present

## 2024-07-28 DIAGNOSIS — M4326 Fusion of spine, lumbar region: Secondary | ICD-10-CM | POA: Diagnosis not present

## 2024-07-28 DIAGNOSIS — M4726 Other spondylosis with radiculopathy, lumbar region: Secondary | ICD-10-CM | POA: Diagnosis not present

## 2024-07-28 DIAGNOSIS — M4316 Spondylolisthesis, lumbar region: Secondary | ICD-10-CM | POA: Diagnosis not present
